# Patient Record
Sex: Male | Born: 1950 | Race: White | Hispanic: No | Marital: Married | Smoking: Never smoker
Health system: Southern US, Community
[De-identification: ages and names within clinical notes are randomized; demographics above are authoritative.]

## PROBLEM LIST (undated history)

## (undated) DIAGNOSIS — F419 Anxiety disorder, unspecified: Secondary | ICD-10-CM

## (undated) DIAGNOSIS — E7800 Pure hypercholesterolemia, unspecified: Secondary | ICD-10-CM

## (undated) DIAGNOSIS — I10 Essential (primary) hypertension: Secondary | ICD-10-CM

---

## 2006-10-01 ENCOUNTER — Ambulatory Visit (HOSPITAL_BASED_OUTPATIENT_CLINIC_OR_DEPARTMENT_OTHER): Admission: RE | Admit: 2006-10-01 | Discharge: 2006-10-01 | Payer: Self-pay | Admitting: Family Medicine

## 2006-10-07 ENCOUNTER — Ambulatory Visit: Payer: Self-pay | Admitting: Internal Medicine

## 2006-11-29 ENCOUNTER — Ambulatory Visit (HOSPITAL_BASED_OUTPATIENT_CLINIC_OR_DEPARTMENT_OTHER): Admission: RE | Admit: 2006-11-29 | Discharge: 2006-11-29 | Payer: Self-pay | Admitting: Family Medicine

## 2006-12-02 ENCOUNTER — Ambulatory Visit: Payer: Self-pay | Admitting: Internal Medicine

## 2016-09-08 DIAGNOSIS — Z23 Encounter for immunization: Secondary | ICD-10-CM | POA: Diagnosis not present

## 2016-09-25 DIAGNOSIS — R972 Elevated prostate specific antigen [PSA]: Secondary | ICD-10-CM | POA: Diagnosis not present

## 2016-09-25 DIAGNOSIS — N400 Enlarged prostate without lower urinary tract symptoms: Secondary | ICD-10-CM | POA: Diagnosis not present

## 2016-09-25 DIAGNOSIS — N5201 Erectile dysfunction due to arterial insufficiency: Secondary | ICD-10-CM | POA: Diagnosis not present

## 2016-10-12 DIAGNOSIS — Z0000 Encounter for general adult medical examination without abnormal findings: Secondary | ICD-10-CM | POA: Diagnosis not present

## 2016-10-12 DIAGNOSIS — I10 Essential (primary) hypertension: Secondary | ICD-10-CM | POA: Diagnosis not present

## 2016-10-12 DIAGNOSIS — Z6830 Body mass index (BMI) 30.0-30.9, adult: Secondary | ICD-10-CM | POA: Diagnosis not present

## 2016-10-12 DIAGNOSIS — E7800 Pure hypercholesterolemia, unspecified: Secondary | ICD-10-CM | POA: Diagnosis not present

## 2016-10-24 DIAGNOSIS — S83241A Other tear of medial meniscus, current injury, right knee, initial encounter: Secondary | ICD-10-CM | POA: Diagnosis not present

## 2016-10-30 DIAGNOSIS — M25561 Pain in right knee: Secondary | ICD-10-CM | POA: Diagnosis not present

## 2016-11-06 DIAGNOSIS — S83241A Other tear of medial meniscus, current injury, right knee, initial encounter: Secondary | ICD-10-CM | POA: Diagnosis not present

## 2016-11-20 DIAGNOSIS — Z01818 Encounter for other preprocedural examination: Secondary | ICD-10-CM | POA: Diagnosis not present

## 2016-11-20 DIAGNOSIS — J329 Chronic sinusitis, unspecified: Secondary | ICD-10-CM | POA: Diagnosis not present

## 2016-12-08 DIAGNOSIS — G8918 Other acute postprocedural pain: Secondary | ICD-10-CM | POA: Diagnosis not present

## 2016-12-08 DIAGNOSIS — M94261 Chondromalacia, right knee: Secondary | ICD-10-CM | POA: Diagnosis not present

## 2016-12-08 DIAGNOSIS — M23261 Derangement of other lateral meniscus due to old tear or injury, right knee: Secondary | ICD-10-CM | POA: Diagnosis not present

## 2016-12-08 DIAGNOSIS — S83281A Other tear of lateral meniscus, current injury, right knee, initial encounter: Secondary | ICD-10-CM | POA: Diagnosis not present

## 2016-12-08 DIAGNOSIS — S83241A Other tear of medial meniscus, current injury, right knee, initial encounter: Secondary | ICD-10-CM | POA: Diagnosis not present

## 2016-12-08 DIAGNOSIS — M23231 Derangement of other medial meniscus due to old tear or injury, right knee: Secondary | ICD-10-CM | POA: Diagnosis not present

## 2016-12-14 DIAGNOSIS — S83241D Other tear of medial meniscus, current injury, right knee, subsequent encounter: Secondary | ICD-10-CM | POA: Diagnosis not present

## 2016-12-14 DIAGNOSIS — S83281D Other tear of lateral meniscus, current injury, right knee, subsequent encounter: Secondary | ICD-10-CM | POA: Diagnosis not present

## 2017-01-08 DIAGNOSIS — S83281D Other tear of lateral meniscus, current injury, right knee, subsequent encounter: Secondary | ICD-10-CM | POA: Diagnosis not present

## 2017-01-08 DIAGNOSIS — S83241D Other tear of medial meniscus, current injury, right knee, subsequent encounter: Secondary | ICD-10-CM | POA: Diagnosis not present

## 2017-02-20 DIAGNOSIS — L814 Other melanin hyperpigmentation: Secondary | ICD-10-CM | POA: Diagnosis not present

## 2017-02-20 DIAGNOSIS — D1800 Hemangioma unspecified site: Secondary | ICD-10-CM | POA: Diagnosis not present

## 2017-02-20 DIAGNOSIS — C4401 Basal cell carcinoma of skin of lip: Secondary | ICD-10-CM | POA: Diagnosis not present

## 2017-02-20 DIAGNOSIS — D225 Melanocytic nevi of trunk: Secondary | ICD-10-CM | POA: Diagnosis not present

## 2017-02-20 DIAGNOSIS — L570 Actinic keratosis: Secondary | ICD-10-CM | POA: Diagnosis not present

## 2017-02-20 DIAGNOSIS — C44319 Basal cell carcinoma of skin of other parts of face: Secondary | ICD-10-CM | POA: Diagnosis not present

## 2017-03-05 DIAGNOSIS — Z23 Encounter for immunization: Secondary | ICD-10-CM | POA: Diagnosis not present

## 2017-03-05 DIAGNOSIS — Z0000 Encounter for general adult medical examination without abnormal findings: Secondary | ICD-10-CM | POA: Diagnosis not present

## 2017-03-05 DIAGNOSIS — Z125 Encounter for screening for malignant neoplasm of prostate: Secondary | ICD-10-CM | POA: Diagnosis not present

## 2017-03-05 DIAGNOSIS — E7800 Pure hypercholesterolemia, unspecified: Secondary | ICD-10-CM | POA: Diagnosis not present

## 2017-03-13 DIAGNOSIS — Z0000 Encounter for general adult medical examination without abnormal findings: Secondary | ICD-10-CM | POA: Diagnosis not present

## 2017-03-13 DIAGNOSIS — R972 Elevated prostate specific antigen [PSA]: Secondary | ICD-10-CM | POA: Diagnosis not present

## 2017-03-13 DIAGNOSIS — I10 Essential (primary) hypertension: Secondary | ICD-10-CM | POA: Diagnosis not present

## 2017-03-13 DIAGNOSIS — E7800 Pure hypercholesterolemia, unspecified: Secondary | ICD-10-CM | POA: Diagnosis not present

## 2017-04-13 DIAGNOSIS — K429 Umbilical hernia without obstruction or gangrene: Secondary | ICD-10-CM | POA: Diagnosis not present

## 2017-04-18 DIAGNOSIS — C4401 Basal cell carcinoma of skin of lip: Secondary | ICD-10-CM | POA: Diagnosis not present

## 2017-06-04 DIAGNOSIS — G4733 Obstructive sleep apnea (adult) (pediatric): Secondary | ICD-10-CM | POA: Diagnosis not present

## 2017-07-03 DIAGNOSIS — Z23 Encounter for immunization: Secondary | ICD-10-CM | POA: Diagnosis not present

## 2017-09-05 DIAGNOSIS — I129 Hypertensive chronic kidney disease with stage 1 through stage 4 chronic kidney disease, or unspecified chronic kidney disease: Secondary | ICD-10-CM | POA: Diagnosis not present

## 2017-09-05 DIAGNOSIS — E7800 Pure hypercholesterolemia, unspecified: Secondary | ICD-10-CM | POA: Diagnosis not present

## 2017-09-11 DIAGNOSIS — E7800 Pure hypercholesterolemia, unspecified: Secondary | ICD-10-CM | POA: Diagnosis not present

## 2017-09-11 DIAGNOSIS — I10 Essential (primary) hypertension: Secondary | ICD-10-CM | POA: Diagnosis not present

## 2017-09-11 DIAGNOSIS — G4733 Obstructive sleep apnea (adult) (pediatric): Secondary | ICD-10-CM | POA: Diagnosis not present

## 2017-09-11 DIAGNOSIS — Z9989 Dependence on other enabling machines and devices: Secondary | ICD-10-CM | POA: Diagnosis not present

## 2017-09-25 DIAGNOSIS — R972 Elevated prostate specific antigen [PSA]: Secondary | ICD-10-CM | POA: Diagnosis not present

## 2017-10-03 DIAGNOSIS — N5201 Erectile dysfunction due to arterial insufficiency: Secondary | ICD-10-CM | POA: Diagnosis not present

## 2017-10-03 DIAGNOSIS — R972 Elevated prostate specific antigen [PSA]: Secondary | ICD-10-CM | POA: Diagnosis not present

## 2017-10-03 DIAGNOSIS — N400 Enlarged prostate without lower urinary tract symptoms: Secondary | ICD-10-CM | POA: Diagnosis not present

## 2017-10-09 DIAGNOSIS — G4733 Obstructive sleep apnea (adult) (pediatric): Secondary | ICD-10-CM | POA: Diagnosis not present

## 2017-10-23 HISTORY — PX: MENISCUS REPAIR: SHX5179

## 2017-11-09 DIAGNOSIS — G4733 Obstructive sleep apnea (adult) (pediatric): Secondary | ICD-10-CM | POA: Diagnosis not present

## 2017-11-27 DIAGNOSIS — Z9989 Dependence on other enabling machines and devices: Secondary | ICD-10-CM | POA: Diagnosis not present

## 2017-11-27 DIAGNOSIS — G4733 Obstructive sleep apnea (adult) (pediatric): Secondary | ICD-10-CM | POA: Diagnosis not present

## 2017-12-10 DIAGNOSIS — G4733 Obstructive sleep apnea (adult) (pediatric): Secondary | ICD-10-CM | POA: Diagnosis not present

## 2018-01-07 DIAGNOSIS — G4733 Obstructive sleep apnea (adult) (pediatric): Secondary | ICD-10-CM | POA: Diagnosis not present

## 2018-02-07 DIAGNOSIS — G4733 Obstructive sleep apnea (adult) (pediatric): Secondary | ICD-10-CM | POA: Diagnosis not present

## 2018-03-09 DIAGNOSIS — G4733 Obstructive sleep apnea (adult) (pediatric): Secondary | ICD-10-CM | POA: Diagnosis not present

## 2018-03-13 DIAGNOSIS — Z125 Encounter for screening for malignant neoplasm of prostate: Secondary | ICD-10-CM | POA: Diagnosis not present

## 2018-03-13 DIAGNOSIS — I129 Hypertensive chronic kidney disease with stage 1 through stage 4 chronic kidney disease, or unspecified chronic kidney disease: Secondary | ICD-10-CM | POA: Diagnosis not present

## 2018-03-14 DIAGNOSIS — E785 Hyperlipidemia, unspecified: Secondary | ICD-10-CM | POA: Diagnosis not present

## 2018-03-14 DIAGNOSIS — Z0000 Encounter for general adult medical examination without abnormal findings: Secondary | ICD-10-CM | POA: Diagnosis not present

## 2018-03-14 DIAGNOSIS — I456 Pre-excitation syndrome: Secondary | ICD-10-CM | POA: Diagnosis not present

## 2018-03-14 DIAGNOSIS — N529 Male erectile dysfunction, unspecified: Secondary | ICD-10-CM | POA: Diagnosis not present

## 2018-03-14 DIAGNOSIS — M19041 Primary osteoarthritis, right hand: Secondary | ICD-10-CM | POA: Diagnosis not present

## 2018-03-14 DIAGNOSIS — I10 Essential (primary) hypertension: Secondary | ICD-10-CM | POA: Diagnosis not present

## 2018-03-14 DIAGNOSIS — Z23 Encounter for immunization: Secondary | ICD-10-CM | POA: Diagnosis not present

## 2018-03-14 DIAGNOSIS — H9319 Tinnitus, unspecified ear: Secondary | ICD-10-CM | POA: Diagnosis not present

## 2018-03-14 DIAGNOSIS — G4733 Obstructive sleep apnea (adult) (pediatric): Secondary | ICD-10-CM | POA: Diagnosis not present

## 2018-03-14 DIAGNOSIS — R972 Elevated prostate specific antigen [PSA]: Secondary | ICD-10-CM | POA: Diagnosis not present

## 2018-03-15 ENCOUNTER — Other Ambulatory Visit: Payer: Self-pay | Admitting: Urology

## 2018-03-15 DIAGNOSIS — N5201 Erectile dysfunction due to arterial insufficiency: Secondary | ICD-10-CM | POA: Diagnosis not present

## 2018-03-15 DIAGNOSIS — N400 Enlarged prostate without lower urinary tract symptoms: Secondary | ICD-10-CM | POA: Diagnosis not present

## 2018-03-15 DIAGNOSIS — R972 Elevated prostate specific antigen [PSA]: Secondary | ICD-10-CM

## 2018-03-26 DIAGNOSIS — L570 Actinic keratosis: Secondary | ICD-10-CM | POA: Diagnosis not present

## 2018-03-26 DIAGNOSIS — D225 Melanocytic nevi of trunk: Secondary | ICD-10-CM | POA: Diagnosis not present

## 2018-03-26 DIAGNOSIS — L814 Other melanin hyperpigmentation: Secondary | ICD-10-CM | POA: Diagnosis not present

## 2018-03-26 DIAGNOSIS — Z85828 Personal history of other malignant neoplasm of skin: Secondary | ICD-10-CM | POA: Diagnosis not present

## 2018-03-26 DIAGNOSIS — D485 Neoplasm of uncertain behavior of skin: Secondary | ICD-10-CM | POA: Diagnosis not present

## 2018-03-26 DIAGNOSIS — L280 Lichen simplex chronicus: Secondary | ICD-10-CM | POA: Diagnosis not present

## 2018-03-26 DIAGNOSIS — L821 Other seborrheic keratosis: Secondary | ICD-10-CM | POA: Diagnosis not present

## 2018-04-01 DIAGNOSIS — Z1212 Encounter for screening for malignant neoplasm of rectum: Secondary | ICD-10-CM | POA: Diagnosis not present

## 2018-04-01 DIAGNOSIS — Z1211 Encounter for screening for malignant neoplasm of colon: Secondary | ICD-10-CM | POA: Diagnosis not present

## 2018-04-08 ENCOUNTER — Ambulatory Visit
Admission: RE | Admit: 2018-04-08 | Discharge: 2018-04-08 | Disposition: A | Discharge status: Home / Self Care | Payer: Self-pay | Source: Ambulatory Visit | Attending: Urology | Admitting: Urology

## 2018-04-08 DIAGNOSIS — R972 Elevated prostate specific antigen [PSA]: Secondary | ICD-10-CM

## 2018-04-08 MED ORDER — GADOBENATE DIMEGLUMINE 529 MG/ML IV SOLN
20.0000 mL | Freq: Once | INTRAVENOUS | Status: AC | PRN
  Administered 2018-04-08: 20 mL via INTRAVENOUS

## 2018-04-09 DIAGNOSIS — G4733 Obstructive sleep apnea (adult) (pediatric): Secondary | ICD-10-CM | POA: Diagnosis not present

## 2018-04-30 DIAGNOSIS — E785 Hyperlipidemia, unspecified: Secondary | ICD-10-CM | POA: Diagnosis not present

## 2018-05-06 DIAGNOSIS — R972 Elevated prostate specific antigen [PSA]: Secondary | ICD-10-CM | POA: Diagnosis not present

## 2018-05-09 DIAGNOSIS — G4733 Obstructive sleep apnea (adult) (pediatric): Secondary | ICD-10-CM | POA: Diagnosis not present

## 2018-05-14 DIAGNOSIS — R69 Illness, unspecified: Secondary | ICD-10-CM | POA: Diagnosis not present

## 2018-05-17 DIAGNOSIS — R972 Elevated prostate specific antigen [PSA]: Secondary | ICD-10-CM | POA: Diagnosis not present

## 2018-06-05 DIAGNOSIS — N529 Male erectile dysfunction, unspecified: Secondary | ICD-10-CM | POA: Diagnosis not present

## 2018-06-05 DIAGNOSIS — R69 Illness, unspecified: Secondary | ICD-10-CM | POA: Diagnosis not present

## 2018-06-09 DIAGNOSIS — G4733 Obstructive sleep apnea (adult) (pediatric): Secondary | ICD-10-CM | POA: Diagnosis not present

## 2018-06-10 DIAGNOSIS — R195 Other fecal abnormalities: Secondary | ICD-10-CM | POA: Diagnosis not present

## 2018-07-03 DIAGNOSIS — R195 Other fecal abnormalities: Secondary | ICD-10-CM | POA: Diagnosis not present

## 2018-07-03 DIAGNOSIS — K648 Other hemorrhoids: Secondary | ICD-10-CM | POA: Diagnosis not present

## 2018-07-03 DIAGNOSIS — D125 Benign neoplasm of sigmoid colon: Secondary | ICD-10-CM | POA: Diagnosis not present

## 2018-07-03 DIAGNOSIS — K5730 Diverticulosis of large intestine without perforation or abscess without bleeding: Secondary | ICD-10-CM | POA: Diagnosis not present

## 2018-07-05 DIAGNOSIS — R69 Illness, unspecified: Secondary | ICD-10-CM | POA: Diagnosis not present

## 2018-07-05 DIAGNOSIS — D125 Benign neoplasm of sigmoid colon: Secondary | ICD-10-CM | POA: Diagnosis not present

## 2018-07-09 DIAGNOSIS — R69 Illness, unspecified: Secondary | ICD-10-CM | POA: Diagnosis not present

## 2018-07-10 DIAGNOSIS — G4733 Obstructive sleep apnea (adult) (pediatric): Secondary | ICD-10-CM | POA: Diagnosis not present

## 2018-08-09 DIAGNOSIS — G4733 Obstructive sleep apnea (adult) (pediatric): Secondary | ICD-10-CM | POA: Diagnosis not present

## 2018-09-13 DIAGNOSIS — R69 Illness, unspecified: Secondary | ICD-10-CM | POA: Diagnosis not present

## 2018-11-13 DIAGNOSIS — R69 Illness, unspecified: Secondary | ICD-10-CM | POA: Diagnosis not present

## 2018-11-15 DIAGNOSIS — G4733 Obstructive sleep apnea (adult) (pediatric): Secondary | ICD-10-CM | POA: Diagnosis not present

## 2018-12-05 DIAGNOSIS — K429 Umbilical hernia without obstruction or gangrene: Secondary | ICD-10-CM | POA: Diagnosis not present

## 2019-01-08 ENCOUNTER — Other Ambulatory Visit: Payer: Self-pay

## 2019-01-08 ENCOUNTER — Observation Stay (HOSPITAL_BASED_OUTPATIENT_CLINIC_OR_DEPARTMENT_OTHER)
Admission: EM | Admit: 2019-01-08 | Discharge: 2019-01-09 | Disposition: A | Discharge status: Home / Self Care | Payer: Medicare HMO | Attending: Internal Medicine | Admitting: Internal Medicine

## 2019-01-08 ENCOUNTER — Encounter (HOSPITAL_BASED_OUTPATIENT_CLINIC_OR_DEPARTMENT_OTHER): Payer: Self-pay

## 2019-01-08 ENCOUNTER — Emergency Department (HOSPITAL_BASED_OUTPATIENT_CLINIC_OR_DEPARTMENT_OTHER): Payer: Medicare HMO

## 2019-01-08 DIAGNOSIS — G459 Transient cerebral ischemic attack, unspecified: Secondary | ICD-10-CM | POA: Diagnosis not present

## 2019-01-08 DIAGNOSIS — G4733 Obstructive sleep apnea (adult) (pediatric): Secondary | ICD-10-CM | POA: Diagnosis present

## 2019-01-08 DIAGNOSIS — Z79899 Other long term (current) drug therapy: Secondary | ICD-10-CM | POA: Diagnosis not present

## 2019-01-08 DIAGNOSIS — I10 Essential (primary) hypertension: Secondary | ICD-10-CM | POA: Diagnosis not present

## 2019-01-08 DIAGNOSIS — R531 Weakness: Secondary | ICD-10-CM | POA: Diagnosis present

## 2019-01-08 DIAGNOSIS — M6281 Muscle weakness (generalized): Secondary | ICD-10-CM | POA: Insufficient documentation

## 2019-01-08 DIAGNOSIS — E785 Hyperlipidemia, unspecified: Secondary | ICD-10-CM | POA: Diagnosis present

## 2019-01-08 DIAGNOSIS — R69 Illness, unspecified: Secondary | ICD-10-CM | POA: Diagnosis not present

## 2019-01-08 DIAGNOSIS — Z823 Family history of stroke: Secondary | ICD-10-CM | POA: Diagnosis not present

## 2019-01-08 DIAGNOSIS — F1010 Alcohol abuse, uncomplicated: Secondary | ICD-10-CM | POA: Diagnosis not present

## 2019-01-08 DIAGNOSIS — R200 Anesthesia of skin: Secondary | ICD-10-CM | POA: Diagnosis not present

## 2019-01-08 HISTORY — DX: Pure hypercholesterolemia, unspecified: E78.00

## 2019-01-08 HISTORY — DX: Essential (primary) hypertension: I10

## 2019-01-08 HISTORY — DX: Anxiety disorder, unspecified: F41.9

## 2019-01-08 LAB — PROTIME-INR
INR: 1 (ref 0.8–1.2)
Prothrombin Time: 13.2 seconds (ref 11.4–15.2)

## 2019-01-08 LAB — CBC
HCT: 49.8 % (ref 39.0–52.0)
HEMOGLOBIN: 15.8 g/dL (ref 13.0–17.0)
MCH: 31.7 pg (ref 26.0–34.0)
MCHC: 31.7 g/dL (ref 30.0–36.0)
MCV: 100 fL (ref 80.0–100.0)
Platelets: 193 10*3/uL (ref 150–400)
RBC: 4.98 MIL/uL (ref 4.22–5.81)
RDW: 12.6 % (ref 11.5–15.5)
WBC: 6.5 10*3/uL (ref 4.0–10.5)
nRBC: 0 % (ref 0.0–0.2)

## 2019-01-08 LAB — URINALYSIS, ROUTINE W REFLEX MICROSCOPIC
BILIRUBIN URINE: NEGATIVE
Glucose, UA: NEGATIVE mg/dL
Ketones, ur: NEGATIVE mg/dL
Leukocytes,Ua: NEGATIVE
Nitrite: NEGATIVE
Protein, ur: NEGATIVE mg/dL
Specific Gravity, Urine: <1.005 — ABNORMAL LOW (ref 1.005–1.030)
pH: 5.5 (ref 5.0–8.0)

## 2019-01-08 LAB — DIFFERENTIAL
Abs Immature Granulocytes: 0.02 10*3/uL (ref 0.00–0.07)
Basophils Absolute: 0 10*3/uL (ref 0.0–0.1)
Basophils Relative: 1 %
Eosinophils Absolute: 0.1 10*3/uL (ref 0.0–0.5)
Eosinophils Relative: 2 %
Immature Granulocytes: 0 %
Lymphocytes Relative: 28 %
Lymphs Abs: 1.8 10*3/uL (ref 0.7–4.0)
Monocytes Absolute: 0.8 10*3/uL (ref 0.1–1.0)
Monocytes Relative: 12 %
NEUTROS ABS: 3.8 10*3/uL (ref 1.7–7.7)
NEUTROS PCT: 57 %

## 2019-01-08 LAB — APTT: aPTT: 31 seconds (ref 24–36)

## 2019-01-08 LAB — COMPREHENSIVE METABOLIC PANEL
ALT: 39 U/L (ref 0–44)
AST: 31 U/L (ref 15–41)
Albumin: 4.5 g/dL (ref 3.5–5.0)
Alkaline Phosphatase: 49 U/L (ref 38–126)
Anion gap: 7 (ref 5–15)
BUN: 19 mg/dL (ref 8–23)
CO2: 24 mmol/L (ref 22–32)
Calcium: 9.6 mg/dL (ref 8.9–10.3)
Chloride: 106 mmol/L (ref 98–111)
Creatinine, Ser: 0.92 mg/dL (ref 0.61–1.24)
GFR calc Af Amer: >60 mL/min (ref >60)
GFR calc non Af Amer: >60 mL/min (ref >60)
Glucose, Bld: 88 mg/dL (ref 70–99)
Potassium: 3.8 mmol/L (ref 3.5–5.1)
Sodium: 137 mmol/L (ref 135–145)
Total Bilirubin: 0.5 mg/dL (ref 0.3–1.2)
Total Protein: 7.8 g/dL (ref 6.5–8.1)

## 2019-01-08 LAB — RAPID URINE DRUG SCREEN, HOSP PERFORMED
Amphetamines: NOT DETECTED
Barbiturates: NOT DETECTED
Benzodiazepines: NOT DETECTED
COCAINE: NOT DETECTED
Opiates: NOT DETECTED
Tetrahydrocannabinol: NOT DETECTED

## 2019-01-08 LAB — URINALYSIS, MICROSCOPIC (REFLEX): WBC, UA: NONE SEEN WBC/hpf (ref 0–5)

## 2019-01-08 LAB — ETHANOL: Alcohol, Ethyl (B): 64 mg/dL — ABNORMAL HIGH (ref <10)

## 2019-01-08 MED ORDER — ASPIRIN 81 MG PO CHEW
324.0000 mg | CHEWABLE_TABLET | Freq: Once | ORAL | Status: AC
  Administered 2019-01-08: 324 mg via ORAL
  Filled 2019-01-08: qty 4

## 2019-01-08 NOTE — ED Provider Notes (Signed)
Emergency Department Provider Note   I have reviewed the triage vital signs and the nursing notes.   HISTORY  Chief Complaint Numbness   HPI Thomas Bass is a 68 y.o. male with PMH of HTN and HLD to the emergency department for evaluation of acute onset right arm and leg weakness/numbness.  Symptoms occurred 90 minutes prior to ED presentation.  Last seen normal at 5PM.  Patient states he has had 3-4 beers which is not unusual for him.  He was not feeling intoxicated.  He went to urinate and felt fine.  He states when he turned to leave he was unable to move his right leg or arm.  He felt numbness "like they were asleep" and almost fell.  He was able to catch himself and stayed in place for approximately 4 minutes at which time he found his symptoms had mostly resolved.  He looked in the mirror and did not notice any facial asymmetry but did have difficulty moving his right arm toward his nose.  Denies any headache.  No neck or back pain.  No fall with head trauma.  Patient is compliant with his medications and sees his PCP regularly. Denies any CP or palpitations.   Past Medical History:  Diagnosis Date   Anxiety    High cholesterol    Hypertension     Patient Active Problem List   Diagnosis Date Noted   TIA (transient ischemic attack) 01/08/2019    Past Surgical History:  Procedure Laterality Date   MENISCUS REPAIR Right 2019   Allergies Patient has no known allergies.  No family history on file.  Social History Social History   Tobacco Use   Smoking status: Never Smoker   Smokeless tobacco: Never Used  Substance Use Topics   Alcohol use: Yes    Comment: weekly   Drug use: Not on file    Review of Systems  Constitutional: No fever/chills Eyes: No visual changes. ENT: No sore throat. Cardiovascular: Denies chest pain. Respiratory: Denies shortness of breath. Gastrointestinal: No abdominal pain.  No nausea, no vomiting.  No diarrhea.  No  constipation. Genitourinary: Negative for dysuria. Musculoskeletal: Negative for back pain. Skin: Negative for rash. Neurological: Negative for headaches. Positive right arm/leg weakness and numbness (resolved).   10-point ROS otherwise negative.  ____________________________________________   PHYSICAL EXAM:  VITAL SIGNS: ED Triage Vitals  Enc Vitals Group     BP 01/08/19 1821 (!) 138/97     Pulse Rate 01/08/19 1821 80     Resp 01/08/19 1821 18     Temp 01/08/19 1821 98.6 F (37 C)     Temp Source 01/08/19 1821 Oral     SpO2 01/08/19 1821 97 %     Weight 01/08/19 1822 222 lb 7.1 oz (100.9 kg)     Height 01/08/19 1822 5\' 10"  (1.778 m)   Constitutional: Alert and oriented. Well appearing and in no acute distress. Eyes: Conjunctivae are normal. PERRL.  Head: Atraumatic. Nose: No congestion/rhinnorhea. Mouth/Throat: Mucous membranes are moist.  Neck: No stridor.  Cardiovascular: Normal rate, regular rhythm. Good peripheral circulation. Grossly normal heart sounds.   Respiratory: Normal respiratory effort.  No retractions. Lungs CTAB. Gastrointestinal: Soft and nontender. No distention.  Musculoskeletal: No lower extremity tenderness nor edema. No gross deformities of extremities. Neurologic:  Normal speech and language. No gross focal neurologic deficits are appreciated. CN exam 2-12 normal. Normal finger to nose. Normal grip strength. No pronator drift. Normal sensation and strength in the lower  extremities. No leg drift.    ____________________________________________   LABS (all labs ordered are listed, but only abnormal results are displayed)  Labs Reviewed  ETHANOL - Abnormal; Notable for the following components:      Result Value   Alcohol, Ethyl (B) 64 (*)    All other components within normal limits  URINALYSIS, ROUTINE W REFLEX MICROSCOPIC - Abnormal; Notable for the following components:   Color, Urine STRAW (*)    Specific Gravity, Urine <1.005 (*)    Hgb  urine dipstick TRACE (*)    All other components within normal limits  URINALYSIS, MICROSCOPIC (REFLEX) - Abnormal; Notable for the following components:   Bacteria, UA RARE (*)    All other components within normal limits  PROTIME-INR  APTT  CBC  DIFFERENTIAL  COMPREHENSIVE METABOLIC PANEL  RAPID URINE DRUG SCREEN, HOSP PERFORMED   ____________________________________________  EKG   EKG Interpretation  Date/Time:  Wednesday January 08 2019 18:38:15 EDT Ventricular Rate:  81 PR Interval:    QRS Duration: 83 QT Interval:  360 QTC Calculation: 418 R Axis:   53 Text Interpretation:  Sinus rhythm Baseline wander in lead(s) V1 No STEMI.  Confirmed by Nanda Quinton [phone]) on 01/08/2019 6:48:35 PM       ____________________________________________  RADIOLOGY  Ct Head Wo Contrast  Result Date: 01/08/2019 CLINICAL DATA:  Right arm and leg numbness for 2 hours. EXAM: CT HEAD WITHOUT CONTRAST TECHNIQUE: Contiguous axial images were obtained from the base of the skull through the vertex without intravenous contrast. COMPARISON:  None. FINDINGS: Brain: There is no evidence for acute hemorrhage, hydrocephalus, mass lesion, or abnormal extra-axial fluid collection. No definite CT evidence for acute infarction. Diffuse loss of parenchymal volume is consistent with atrophy. Patchy low attenuation in the deep hemispheric and periventricular white matter is nonspecific, but likely reflects chronic microvascular ischemic demyelination. Age-indeterminate lacunar infarct noted anterior limb right internal capsule. Vascular: No hyperdense vessel or unexpected calcification. Skull: No evidence for fracture. No worrisome lytic or sclerotic lesion. Sinuses/Orbits: The visualized paranasal sinuses and mastoid air cells are clear. Visualized portions of the globes and intraorbital fat are unremarkable. Other: None. IMPRESSION: 1. No acute intracranial abnormality. 2. Atrophy with chronic small vessel white  matter ischemic disease. 3. 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 Electronically Signed   By: Misty Stanley M.D.   On: 01/08/2019 19:16    ____________________________________________   PROCEDURES  Procedure(s) performed:   Procedures  None  ____________________________________________   INITIAL IMPRESSION / ASSESSMENT AND PLAN / ED COURSE  Pertinent labs & imaging results that were available during my care of the patient were reviewed by me and considered in my medical decision making (see chart for details).  Patient presents to the emergency department for evaluation of strokelike symptoms which have since resolved.  Concern clinically for TIA.  No back or neck pain.  No focal deficits on my evaluation here. Plan for CT head, ASA, and labs. Considering admit for TIA pending results.   07:33 PM Spoke with Dr. Londell Moh who will consult when at [facility]. Will admit for TIA w/u. Labs and CT imaging reviewed. Patient remains neuro intact. Discussed results and plan with patient and family at bedside.   Discussed patient's case with Hospitalist, Dr. Alcario Drought to request admission. Patient and family (if present) updated with plan. Care transferred to Hospitalist service.  I reviewed all nursing notes, vitals, pertinent old records, EKGs, labs, imaging (as available).  ____________________________________________  FINAL CLINICAL IMPRESSION(S) /  ED DIAGNOSES  Final diagnoses:  TIA (transient ischemic attack)    MEDICATIONS GIVEN DURING THIS VISIT:  Medications  aspirin chewable tablet 324 mg (324 mg Oral Given 01/08/19 1851)    Note:  This document was prepared using Dragon voice recognition software and may include unintentional dictation errors.  Nanda Quinton, MD Emergency Medicine    Zulema Pulaski, [facility], MD 01/08/19 [phone]

## 2019-01-08 NOTE — Plan of Care (Signed)
TIA with R sided weakness for about 3 mins duration, now back to NL.  Dr. Lorraine Lax will put on list to see.  Sending to Tele obs.

## 2019-01-08 NOTE — Progress Notes (Addendum)
Patient arrived to 3W04; A/O x 4; VSS; No complaint of pain; Room air; Tele notified, Physician Notified

## 2019-01-08 NOTE — ED Triage Notes (Signed)
90 minutes ago, pt was standing at the sink and went to turn and his right arm and leg went numb and stopped working for approximately 3 minutes. Pt denies any current symptoms, no facial asymmetry, no weakness, pt is alert and oriented and was ambulatory without issue.

## 2019-01-09 ENCOUNTER — Observation Stay (HOSPITAL_COMMUNITY): Payer: Medicare HMO

## 2019-01-09 ENCOUNTER — Observation Stay (HOSPITAL_BASED_OUTPATIENT_CLINIC_OR_DEPARTMENT_OTHER): Payer: Medicare HMO

## 2019-01-09 ENCOUNTER — Other Ambulatory Visit (HOSPITAL_COMMUNITY): Payer: Medicare HMO

## 2019-01-09 ENCOUNTER — Encounter (HOSPITAL_COMMUNITY): Payer: Self-pay | Admitting: Internal Medicine

## 2019-01-09 DIAGNOSIS — G459 Transient cerebral ischemic attack, unspecified: Secondary | ICD-10-CM | POA: Diagnosis not present

## 2019-01-09 DIAGNOSIS — E785 Hyperlipidemia, unspecified: Secondary | ICD-10-CM | POA: Diagnosis not present

## 2019-01-09 DIAGNOSIS — R69 Illness, unspecified: Secondary | ICD-10-CM | POA: Diagnosis not present

## 2019-01-09 DIAGNOSIS — R9082 White matter disease, unspecified: Secondary | ICD-10-CM | POA: Diagnosis not present

## 2019-01-09 DIAGNOSIS — G4733 Obstructive sleep apnea (adult) (pediatric): Secondary | ICD-10-CM

## 2019-01-09 DIAGNOSIS — F1010 Alcohol abuse, uncomplicated: Secondary | ICD-10-CM | POA: Diagnosis not present

## 2019-01-09 DIAGNOSIS — R918 Other nonspecific abnormal finding of lung field: Secondary | ICD-10-CM | POA: Diagnosis not present

## 2019-01-09 DIAGNOSIS — I10 Essential (primary) hypertension: Secondary | ICD-10-CM | POA: Diagnosis present

## 2019-01-09 LAB — ECHOCARDIOGRAM COMPLETE
Height: 70 in
Weight: 3520.31 oz

## 2019-01-09 LAB — HEMOGLOBIN A1C
HEMOGLOBIN A1C: 5.4 % (ref 4.8–5.6)
Mean Plasma Glucose: 108.28 mg/dL

## 2019-01-09 LAB — LIPID PANEL
Cholesterol: 200 mg/dL (ref 0–200)
HDL: 49 mg/dL (ref >40)
LDL Cholesterol: 125 mg/dL — ABNORMAL HIGH (ref 0–99)
TRIGLYCERIDES: 131 mg/dL (ref <150)
Total CHOL/HDL Ratio: 4.1 RATIO
VLDL: 26 mg/dL (ref 0–40)

## 2019-01-09 LAB — HIV ANTIBODY (ROUTINE TESTING W REFLEX): HIV Screen 4th Generation wRfx: NONREACTIVE

## 2019-01-09 MED ORDER — ACETAMINOPHEN 650 MG RE SUPP: 650.0000 mg | RECTAL | Status: DC | PRN

## 2019-01-09 MED ORDER — ASPIRIN 325 MG PO TABS: 325.0000 mg | ORAL_TABLET | Freq: Every day | ORAL | Status: DC

## 2019-01-09 MED ORDER — CLOPIDOGREL BISULFATE 75 MG PO TABS: 75.0000 mg | ORAL_TABLET | Freq: Every day | ORAL | 0 refills | Status: AC

## 2019-01-09 MED ORDER — STROKE: EARLY STAGES OF RECOVERY BOOK
Freq: Once | Status: AC
  Administered 2019-01-09: 01:00:00

## 2019-01-09 MED ORDER — ASPIRIN EC 81 MG PO TBEC
81.0000 mg | DELAYED_RELEASE_TABLET | Freq: Every day | ORAL | Status: DC
  Administered 2019-01-09: 81 mg via ORAL
  Filled 2019-01-09: qty 1

## 2019-01-09 MED ORDER — ENOXAPARIN SODIUM 40 MG/0.4ML ~~LOC~~ SOLN
40.0000 mg | Freq: Every day | SUBCUTANEOUS | Status: DC
  Administered 2019-01-09: 40 mg via SUBCUTANEOUS
  Filled 2019-01-09: qty 0.4

## 2019-01-09 MED ORDER — ACETAMINOPHEN 160 MG/5ML PO SOLN: 650.0000 mg | ORAL | Status: DC | PRN

## 2019-01-09 MED ORDER — ROSUVASTATIN CALCIUM 20 MG PO TABS
40.0000 mg | ORAL_TABLET | Freq: Every day | ORAL | Status: DC
  Administered 2019-01-09: 40 mg via ORAL
  Filled 2019-01-09: qty 2

## 2019-01-09 MED ORDER — CLOPIDOGREL BISULFATE 75 MG PO TABS: 75.0000 mg | ORAL_TABLET | Freq: Every day | ORAL | Status: DC

## 2019-01-09 MED ORDER — ASPIRIN 81 MG PO TBEC: 81.0000 mg | DELAYED_RELEASE_TABLET | Freq: Every day | ORAL | Status: AC

## 2019-01-09 MED ORDER — ACETAMINOPHEN 325 MG PO TABS: 650.0000 mg | ORAL_TABLET | ORAL | Status: DC | PRN

## 2019-01-09 MED ORDER — ASPIRIN 300 MG RE SUPP: 300.0000 mg | Freq: Every day | RECTAL | Status: DC

## 2019-01-09 MED ORDER — CLOPIDOGREL BISULFATE 75 MG PO TABS
300.0000 mg | ORAL_TABLET | Freq: Once | ORAL | Status: AC
  Administered 2019-01-09: 300 mg via ORAL
  Filled 2019-01-09: qty 4

## 2019-01-09 MED ORDER — ROSUVASTATIN CALCIUM 40 MG PO TABS: 40.0000 mg | ORAL_TABLET | Freq: Every day | ORAL | 1 refills | Status: AC

## 2019-01-09 MED ORDER — SENNOSIDES-DOCUSATE SODIUM 8.6-50 MG PO TABS: 1.0000 | ORAL_TABLET | Freq: Every evening | ORAL | Status: DC | PRN

## 2019-01-09 NOTE — Progress Notes (Addendum)
STROKE TEAM PROGRESS NOTE   INTERVAL HISTORY His wife is at the bedside.  He reported R sided arm greater than leg weakness 2-4 minutes. He was alone and did not talk. No hx prior TIAs.he denied any accompanying headache, dizziness or vision problems. He has no prior history of strokes or TIAs  Vitals:   01/09/19 0247 01/09/19 0445 01/09/19 0646 01/09/19 0805  BP: (!) 129/92 126/86 114/83 123/78  Pulse: 66 63 63 66  Resp: 18 18 19 16   Temp: 97.8 F (36.6 C) 98.1 F (36.7 C) 98.2 F (36.8 C) 98 F (36.7 C)  TempSrc: Oral Oral Oral Oral  SpO2: 98% 97% 98% 96%  Weight:      Height:        CBC:  Recent Labs  Lab 01/08/19 1844  WBC 6.5  NEUTROABS 3.8  HGB 15.8  HCT 49.8  MCV 100.0  PLT 573    Basic Metabolic Panel:  Recent Labs  Lab 01/08/19 1844  NA 137  K 3.8  CL 106  CO2 24  GLUCOSE 88  BUN 19  CREATININE 0.92  CALCIUM 9.6   Lipid Panel:     Component Value Date/Time   CHOL 200 01/09/2019 0442   TRIG 131 01/09/2019 0442   HDL 49 01/09/2019 0442   CHOLHDL 4.1 01/09/2019 0442   VLDL 26 01/09/2019 0442   LDLCALC 125 (H) 01/09/2019 0442   HgbA1c:  Lab Results  Component Value Date   HGBA1C 5.4 01/09/2019   Urine Drug Screen:     Component Value Date/Time   LABOPIA NONE DETECTED 01/08/2019 1844   COCAINSCRNUR NONE DETECTED 01/08/2019 1844   LABBENZ NONE DETECTED 01/08/2019 1844   AMPHETMU NONE DETECTED 01/08/2019 1844   THCU NONE DETECTED 01/08/2019 1844   LABBARB NONE DETECTED 01/08/2019 1844    Alcohol Level     Component Value Date/Time   ETH 64 (H) 01/08/2019 1844    IMAGING Dg Chest 2 View  Result Date: 01/09/2019 CLINICAL DATA:  TIA. EXAM: CHEST - 2 VIEW COMPARISON:  No recent prior. FINDINGS: Mediastinum and hilar structures normal. Solitary nodular opacities noted over both lung bases most likely nipple shadows. Confirmation with repeat PA and lateral chest x-ray with nipple markers suggested no focal infiltrate. Biapical pleural  thickening noted consistent scarring. No pleural effusion or pneumothorax. Heart size normal. Degenerative change thoracic spine IMPRESSION: Solitary nodular opacities noted both lung bases most likely nipple shadows. Confirmation with repeat PA and lateral chest x-ray with nipple markers suggested. No acute cardiopulmonary disease noted. Electronically Signed   By: Marcello Moores  Register   On: 01/09/2019 06:39   Ct Head Wo Contrast  Result Date: 01/08/2019 CLINICAL DATA:  Right arm and leg numbness for 2 hours. EXAM: CT HEAD WITHOUT CONTRAST TECHNIQUE: Contiguous axial images were obtained from the base of the skull through the vertex without intravenous contrast. COMPARISON:  None. FINDINGS: Brain: There is no evidence for acute hemorrhage, hydrocephalus, mass lesion, or abnormal extra-axial fluid collection. No definite CT evidence for acute infarction. Diffuse loss of parenchymal volume is consistent with atrophy. Patchy low attenuation in the deep hemispheric and periventricular white matter is nonspecific, but likely reflects chronic microvascular ischemic demyelination. Age-indeterminate lacunar infarct noted anterior limb right internal capsule. Vascular: No hyperdense vessel or unexpected calcification. Skull: No evidence for fracture. No worrisome lytic or sclerotic lesion. Sinuses/Orbits: The visualized paranasal sinuses and mastoid air cells are clear. Visualized portions of the globes and intraorbital fat are unremarkable. Other: None. IMPRESSION:  1. No acute intracranial abnormality. 2. Atrophy with chronic small vessel white matter ischemic disease. 3. 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 Electronically Signed   By: Misty Stanley M.D.   On: 01/08/2019 19:16   Mr Virgel Paling EX Contrast  Result Date: 01/09/2019 CLINICAL DATA:  Initial evaluation for acute episode of right-sided weakness/numbness, followed by complete resolution of symptoms. EXAM: MRI HEAD WITHOUT  CONTRAST MRA HEAD WITHOUT CONTRAST TECHNIQUE: Multiplanar, multiecho pulse sequences of the brain and surrounding structures were obtained without intravenous contrast. Angiographic images of the head were obtained using MRA technique without contrast. COMPARISON:  Prior CT from 01/08/2019. FINDINGS: MRI HEAD FINDINGS Brain: Cerebral volume within normal limits for age. Patchy T2/FLAIR hyperintensity within the periventricular deep white matter both cerebral hemispheres, nonspecific, but most like related chronic micro vessel ischemic disease double mild to moderate nature. No abnormal foci of restricted diffusion to suggest acute or subacute ischemia. Gray-white matter differentiation maintained. No encephalomalacia to suggest chronic cortical infarction. No evidence for acute or chronic intracranial hemorrhage. No mass lesion, midline shift or mass effect. No hydrocephalus. No extra-axial fluid collection. Pituitary gland suprasellar region within normal limits. Midline structures intact. Vascular: Major intracranial vascular flow voids maintained. Skull and upper cervical spine: Craniocervical junction normal. Upper cervical spine within normal limits. Bone marrow signal intensity normal. No scalp soft tissue abnormality. Sinuses/Orbits: Globes orbital soft tissues within normal limits. Mild scattered mucosal thickening within the ethmoidal air cells. Paranasal sinuses are otherwise largely clear. No mastoid effusion. Inner ear structures normal. Other: None. MRA HEAD FINDINGS ANTERIOR CIRCULATION: Distal cervical segments of the internal carotid arteries are widely patent with symmetric antegrade flow. Petrous, cavernous, and supraclinoid segments widely patent without stenosis. A1 segments widely patent. Normal anterior communicating artery. Anterior cerebral arteries widely patent to their distal aspects. M1 segments widely patent without stenosis or occlusion. Normal MCA bifurcations. Distal MCA branches well  perfused and symmetric. POSTERIOR CIRCULATION: Vertebral arteries widely patent to the vertebrobasilar junction without stenosis. Posterior inferior cerebral arteries not seen. Basilar widely patent to its distal aspect without stenosis. Superior cerebellar and posterior cerebral arteries widely patent bilaterally. No intracranial aneurysm. IMPRESSION: MRI HEAD IMPRESSION: 1. No acute intracranial infarct or other abnormality identified. 2. Mild-to-moderate cerebral white matter changes common nonspecific, but most like related to chronic microvascular ischemic disease. MRA HEAD IMPRESSION: Normal intracranial MRA. No large vessel occlusion. No hemodynamically significant or correctable stenosis. Electronically Signed   By: Jeannine Boga M.D.   On: 01/09/2019 03:52   Mr Brain Wo Contrast  Result Date: 01/09/2019 CLINICAL DATA:  Initial evaluation for acute episode of right-sided weakness/numbness, followed by complete resolution of symptoms. EXAM: MRI HEAD WITHOUT CONTRAST MRA HEAD WITHOUT CONTRAST TECHNIQUE: Multiplanar, multiecho pulse sequences of the brain and surrounding structures were obtained without intravenous contrast. Angiographic images of the head were obtained using MRA technique without contrast. COMPARISON:  Prior CT from 01/08/2019. FINDINGS: MRI HEAD FINDINGS Brain: Cerebral volume within normal limits for age. Patchy T2/FLAIR hyperintensity within the periventricular deep white matter both cerebral hemispheres, nonspecific, but most like related chronic micro vessel ischemic disease double mild to moderate nature. No abnormal foci of restricted diffusion to suggest acute or subacute ischemia. Gray-white matter differentiation maintained. No encephalomalacia to suggest chronic cortical infarction. No evidence for acute or chronic intracranial hemorrhage. No mass lesion, midline shift or mass effect. No hydrocephalus. No extra-axial fluid collection. Pituitary gland suprasellar region  within normal limits. Midline structures intact. Vascular: Major intracranial  vascular flow voids maintained. Skull and upper cervical spine: Craniocervical junction normal. Upper cervical spine within normal limits. Bone marrow signal intensity normal. No scalp soft tissue abnormality. Sinuses/Orbits: Globes orbital soft tissues within normal limits. Mild scattered mucosal thickening within the ethmoidal air cells. Paranasal sinuses are otherwise largely clear. No mastoid effusion. Inner ear structures normal. Other: None. MRA HEAD FINDINGS ANTERIOR CIRCULATION: Distal cervical segments of the internal carotid arteries are widely patent with symmetric antegrade flow. Petrous, cavernous, and supraclinoid segments widely patent without stenosis. A1 segments widely patent. Normal anterior communicating artery. Anterior cerebral arteries widely patent to their distal aspects. M1 segments widely patent without stenosis or occlusion. Normal MCA bifurcations. Distal MCA branches well perfused and symmetric. POSTERIOR CIRCULATION: Vertebral arteries widely patent to the vertebrobasilar junction without stenosis. Posterior inferior cerebral arteries not seen. Basilar widely patent to its distal aspect without stenosis. Superior cerebellar and posterior cerebral arteries widely patent bilaterally. No intracranial aneurysm. IMPRESSION: MRI HEAD IMPRESSION: 1. No acute intracranial infarct or other abnormality identified. 2. Mild-to-moderate cerebral white matter changes common nonspecific, but most like related to chronic microvascular ischemic disease. MRA HEAD IMPRESSION: Normal intracranial MRA. No large vessel occlusion. No hemodynamically significant or correctable stenosis. Electronically Signed   By: Jeannine Boga M.D.   On: 01/09/2019 03:52    PHYSICAL EXAM  pleasant middle-age Caucasian male not in distress. . Afebrile. Head is nontraumatic. Neck is supple without bruit.    Cardiac exam no murmur or  gallop. Lungs are clear to auscultation. Distal pulses are well felt. Neurological Exam ;  Awake  Alert oriented x 3. Normal speech and language.eye movements full without nystagmus.fundi were not visualized. Vision acuity and fields appear normal. Hearing is normal. Palatal movements are normal. Face symmetric. Tongue midline. Normal strength, tone, reflexes and coordination. Normal sensation. Gait deferred.  NIHSS 0 Premorbid MRS 0 ABCD2 score  6  ASSESSMENT/PLAN [name] is a 68 y.o. male with history of anxiety, HTN, HLD presenting with transient R arm > leg weakness.   L brain TIA  CT head No acute stroke. Small vessel disease. Atrophy. Old ALIC lacune.  MRI  No acute infarct. Small vessel disease.   MRA  Normal. No LVO.  Carotid Doppler  pending   2D Echo w/ bubble  pending   LDL 125  HgbA1c 5.4  Lovenox 40 mg sq daily for VTE prophylaxis  No antithrombotic prior to admission, now on aspirin 81 mg daily and clopidogrel 75 mg daily following plavix load. Continue DAPT x 3 weeks then aspirin alone.  Therapy recommendations:  No therapy needs  Disposition:  Return home Follow-up Stroke Clinic at [facility] in 4 weeks. Office will call with appointment date and time. Order placed.  Hypertension  Stable . BP goal normotensive  Hyperlipidemia  Home meds:  crestor 10  Now on crestor 40  LDL 125, goal < 70  Continue statin at discharge  Other Stroke Risk Factors  Advanced age  ETOH use, he drinks 10-12 beers 1 day a week, advised to drink no more than 2 drink(s) a day  Obesity, Body mass index is 31.57 kg/m., recommend weight loss, diet and exercise as appropriate   Family hx stroke (mother)  obstructive sleep apnea, on CPAP at home  Other [facility] day # 0  Burnetta Sabin, MSN, APRN, ANVP-BC, AGPCNP-BC Advanced Practice Stroke Nurse [place] for Schedule & Pager  information 01/09/2019 10:05  AM  I have personally obtained history,examined this patient, reviewed notes, independently viewed imaging studies, participated in medical decision making and plan of care.ROS completed by me personally and pertinent positives fully documented  I have made any additions or clarifications directly to the above note. Agree with note above. He presented with transient right body weakness likely (hemispheric TIA. Remains at risk for recurrent strokes and TIAs. Continue ongoing stroke workup. Recommend doing an anteverted therapy for 3 weeks followed by aspirin. Patient may also consider possible participation in the [employer]  [facility] stroke prevention prior and will be given information to review and decide.greater than 50% time during this 35 minute visit was spent on counseling and coordination of care about when necessary discussion about stroke prevention and treatment and answering questions. Discussed with Dr. Saul Fordyce, MD Medical Director [place] Pager: [phone] 01/09/2019 2:07 PM  To contact Stroke Continuity provider, please refer to [email or web address] After hours, contact General Neurology

## 2019-01-09 NOTE — Consult Note (Signed)
Requesting Physician: Dr. Alcario Drought    Chief Complaint: Transient right arm weakness  History obtained from: Patient and Chart     HPI:                                                                                                                                       Thomas Bass is an 68 y.o. male with past medical history of hypertension, hyperlipidemia presents to the ER at [facility] with transient right arm and leg weakness.  Patient was drinking a few beers, when he noticed suddenly after using the bathroom while he was turning to leave he was unable to move his right leg arm.  He felt as if they were asleep and lasted for approximately 4 minutes.  Symptoms completely resolved at the time of arrival to the emergency department. He is not on any antiplatelet medications.  He was transferred to [facility] for evaluation of TIA symptoms.  Date last known well: 3.18-20 Time last known well: 5 PM tPA Given: No, symptoms resolved NIHSS: 0 Baseline MRS 0    Past Medical History:  Diagnosis Date  . Anxiety   . High cholesterol   . Hypertension     Past Surgical History:  Procedure Laterality Date  . MENISCUS REPAIR Right 2019    Family History  Problem Relation Age of Onset  . Stroke Mother    Social History:  reports that he has never smoked. He has never used smokeless tobacco. He reports current alcohol use. No history on file for drug.  Allergies: No Known Allergies  Medications:                                                                                                                        I reviewed home medications   ROS:  14 systems reviewed and negative except above   Examination:                                                                                                      General: Appears  well-developed Psych: Affect appropriate to situation Eyes: No scleral injection HENT: No OP obstrucion Head: Normocephalic.  Cardiovascular: Normal rate and regular rhythm.  Respiratory: Effort normal and breath sounds normal to anterior ascultation GI: Soft.  No distension. There is no tenderness.  Skin: WDI    Neurological Examination Mental Status: Alert, oriented, thought content appropriate.  Speech fluent without evidence of aphasia. Able to follow 3 step commands without difficulty. Cranial Nerves: II: Visual fields grossly normal,  III,IV, VI: ptosis not present, extra-ocular motions intact bilaterally, pupils equal, round, reactive to light and accommodation V,VII: smile symmetric, facial light touch sensation normal bilaterally VIII: hearing normal bilaterally IX,X: uvula rises symmetrically XI: bilateral shoulder shrug XII: midline tongue extension Motor: Right : Upper extremity   5/5    Left:     Upper extremity   5/5  Lower extremity   5/5     Lower extremity   5/5 Tone and bulk:normal tone throughout; no atrophy noted Sensory: Pinprick and light touch intact throughout, bilaterally Deep Tendon Reflexes: 2+ and symmetric throughout Plantars: Right: downgoing   Left: downgoing Cerebellar: normal finger-to-nose, normal rapid alternating movements and normal heel-to-shin test Gait: normal gait and station     Lab Results: Basic Metabolic Panel: Recent Labs  Lab 01/08/19 1844  NA 137  K 3.8  CL 106  CO2 24  GLUCOSE 88  BUN 19  CREATININE 0.92  CALCIUM 9.6    CBC: Recent Labs  Lab 01/08/19 1844  WBC 6.5  NEUTROABS 3.8  HGB 15.8  HCT 49.8  MCV 100.0  PLT 193    Coagulation Studies: Recent Labs    01/08/19 1844  LABPROT 13.2  INR 1.0    Imaging: Ct Head Wo Contrast  Result Date: 01/08/2019 CLINICAL DATA:  Right arm and leg numbness for 2 hours. EXAM: CT HEAD WITHOUT CONTRAST TECHNIQUE: Contiguous axial images were obtained from the base  of the skull through the vertex without intravenous contrast. COMPARISON:  None. FINDINGS: Brain: There is no evidence for acute hemorrhage, hydrocephalus, mass lesion, or abnormal extra-axial fluid collection. No definite CT evidence for acute infarction. Diffuse loss of parenchymal volume is consistent with atrophy. Patchy low attenuation in the deep hemispheric and periventricular white matter is nonspecific, but likely reflects chronic microvascular ischemic demyelination. Age-indeterminate lacunar infarct noted anterior limb right internal capsule. Vascular: No hyperdense vessel or unexpected calcification. Skull: No evidence for fracture. No worrisome lytic or sclerotic lesion. Sinuses/Orbits: The visualized paranasal sinuses and mastoid air cells are clear. Visualized portions of the globes and intraorbital fat are unremarkable. Other: None. IMPRESSION: 1. No acute intracranial abnormality. 2. Atrophy with chronic small vessel white matter ischemic disease. 3. 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 Electronically Signed   By: Misty Stanley M.D.   On: 01/08/2019 19:16  ASSESSMENT AND PLAN  68 y.o. male with past medical history of hypertension, hyperlipidemia presents to the  with transient right arm and leg weakness.  Transient Ischemic attack  Recommend # MRI of the brain without contrast #MRA Head  #carotid doppler #Transthoracic Echo  # Start patient on ASA 81mg  daily and Plavix 75mg  daily x 3 weeks  #Start or continue Atorvastatin 80 mg/other high intensity statin # BP goal: permissive HTN upto 220/120 mmHg ( 185/110 if patient has CHF, CKD) # HBAIC and Lipid profile # Telemetry monitoring # Frequent neuro checks # NPO until passes stroke swallow screen  Please page stroke NP  Or  PA  Or MD from 8am -4 pm  as this patient from this time will be  followed by the stroke.   You can look them up on [email or web address]  Password [facility]      [facility] Pager Number [phone]

## 2019-01-09 NOTE — Progress Notes (Signed)
PT Cancellation Note  Patient Details Name: Thomas Bass MRN: [id number] DOB: August 31, 1951   Cancelled Treatment:    Reason Eval/Treat Not Completed: PT screened, no needs identified, will sign off.  Discussed with pt/family before sign off.  They agree. 01/09/2019  Donnella Sham, PT Acute Rehabilitation Services [phone]  (pager) [phone]  (office) 01/09/2019  Donnella Sham, PT Acute Rehabilitation Services [phone]  (pager) [phone]  (office)   Thomas Bass 01/09/2019, 3:06 PM

## 2019-01-09 NOTE — Progress Notes (Signed)
  Echocardiogram 2D Echocardiogram has been performed.  Thomas Bass 01/09/2019, 12:36 PM

## 2019-01-09 NOTE — TOC Initial Note (Signed)
Transition of Care [facility]) - Initial/Assessment Note    Patient Details  Name: Thomas Bass MRN: [id number] Date of Birth: December 08, 1950  Transition of Care [facility]) CM/SW Contact:    Pollie Friar, RN Phone Number: 01/09/2019, 12:45 PM  Clinical Narrative:                   Expected Discharge Plan: Home/Self Care Barriers to Discharge: Continued Medical Work up   Patient Goals and CMS Choice Patient states their goals for this hospitalization and ongoing recovery are:: no deficits      Expected Discharge Plan and Services Expected Discharge Plan: Home/Self Care       Living arrangements for the past 2 months: Single Family Home(2 levels)                          Prior Living Arrangements/Services Living arrangements for the past 2 months: Single Family Home(2 levels) Lives with:: Spouse Patient language and need for interpreter reviewed:: Yes(no needs.) Do you feel safe going back to the place where you live?: Yes      Need for Family Participation in Patient Care: No (Comment) Care giver support system in place?: Yes (comment)(wife able to provide intermittent supervision)   Criminal Activity/Legal Involvement Pertinent to Current Situation/Hospitalization: No - Comment as needed  Activities of Daily Living      Permission Sought/Granted                  Emotional Assessment Appearance:: Appears stated age Attitude/Demeanor/Rapport: Gracious Affect (typically observed): Accepting, Appropriate, Pleasant Orientation: : Oriented to Self, Oriented to Place, Oriented to  Time, Oriented to Situation   Psych Involvement: No (comment)  Admission diagnosis:  TIA (transient ischemic attack) [G45.9] Patient Active Problem List   Diagnosis Date Noted  . HTN (hypertension) 01/09/2019  . HLD (hyperlipidemia) 01/09/2019  . ETOH abuse 01/09/2019  . OSA (obstructive sleep apnea) 01/09/2019  . TIA (transient ischemic attack) 01/08/2019   PCP:  Deland Pretty,  MD Pharmacy:   [facility], [place] [address] Phone: [phone] Fax: [phone]     Social Determinants of Health (SDOH) Interventions  Pt drives or wife can provide transportation. No issues with medications at home.   Readmission Risk Interventions No flowsheet data found.

## 2019-01-09 NOTE — TOC Transition Note (Signed)
Transition of Care [facility]) - CM/SW Discharge Note   Patient Details  Name: Thomas Bass MRN: [id number] Date of Birth: 1951/03/14  Transition of Care [facility]) CM/SW Contact:  Pollie Friar, RN Phone Number: 01/09/2019, 3:01 PM   Clinical Narrative:    No f/u per PT/OT and no DME needs. Wife to transport home.   Final next level of care: Home/Self Care Barriers to Discharge: No Barriers Identified   Patient Goals and CMS Choice Patient states their goals for this hospitalization and ongoing recovery are:: no deficits      Discharge Placement                       Discharge Plan and Services                          Social Determinants of Health (SDOH) Interventions     Readmission Risk Interventions No flowsheet data found.

## 2019-01-09 NOTE — H&P (Signed)
History and Physical    Thomas Bass YNW:[id number] DOB: Aug 15, 1951 DOA: 01/08/2019  PCP: Deland Pretty, MD  Patient coming from: Home  I have personally briefly reviewed patient's old medical records in [facility]  Chief Complaint: R sided weakness  HPI: Thomas Bass is a 68 y.o. male with medical history significant of HLD, HTN.  Patient had 3 min episode of acute onset R arm and leg weakness/numbness.  Occurred 90 min PTA in ED.  Complete resolution of symptoms at time of presentation to ED and remains asymptomatic.  Patient states he has had 3-4 beers which is not unusual for him.  He was not feeling intoxicated.  He went to urinate and felt fine.  He states when he turned to leave he was unable to move his right leg or arm.  He felt numbness "like they were asleep" and almost fell.  He was able to catch himself and stayed in place for approximately 3-4 minutes at which time he found his symptoms had mostly resolved.  He looked in the mirror and did not notice any facial asymmetry but did have difficulty moving his right arm toward his nose.  Denies any headache.  No neck or back pain.  No fall with head trauma.  Patient is compliant with his medications and sees his PCP regularly. Denies any CP or palpitations.    ED Course: Patient remained asymptomatic.  CT head neg other than for an Age indeterminate lacunar infarct anterior limb right internal capsule.  Neurology consulted and hospitalist asked to admit for TIA work up.   Review of Systems: As per HPI otherwise 10 point review of systems negative.   Past Medical History:  Diagnosis Date  . Anxiety   . High cholesterol   . Hypertension     Past Surgical History:  Procedure Laterality Date  . MENISCUS REPAIR Right 2019     reports that he has never smoked. He has never used smokeless tobacco. He reports current alcohol use. No history on file for drug.  No Known Allergies  Family History  Problem Relation  Age of Onset  . Stroke Mother      Prior to Admission medications   Medication Sig Start Date End Date Taking? Authorizing Provider  lisinopril (PRINIVIL,ZESTRIL) 10 MG tablet Take 10 mg by mouth daily.   Yes [provider]  mirtazapine (REMERON) 7.5 MG tablet Take 7.5 mg by mouth at bedtime.   Yes [provider]  rosuvastatin (CRESTOR) 10 MG tablet  12/04/18   [provider]    Physical Exam: Vitals:   01/08/19 1822 01/08/19 2004 01/08/19 2031 01/08/19 2117  BP:  121/84 117/80 (!) 132/98  Pulse:  74 72 69  Resp:  17 17 18   Temp:    97.8 F (36.6 C)  TempSrc:    Oral  SpO2:  99% 100% 100%  Weight: 100.9 kg     Height: 5\' 10"  (1.778 m)       Constitutional: NAD, calm, comfortable Eyes: PERRL, lids and conjunctivae normal ENMT: Mucous membranes are moist. Posterior pharynx clear of any exudate or lesions.Normal dentition.  Neck: normal, supple, no masses, no thyromegaly Respiratory: clear to auscultation bilaterally, no wheezing, no crackles. Normal respiratory effort. No accessory muscle use.  Cardiovascular: Regular rate and rhythm, no murmurs / rubs / gallops. No extremity edema. 2+ pedal pulses. No carotid bruits.  Abdomen: no tenderness, no masses palpated. No hepatosplenomegaly. Bowel sounds positive.  Musculoskeletal: no clubbing /  cyanosis. No joint deformity upper and lower extremities. Good ROM, no contractures. Normal muscle tone.  Skin: no rashes, lesions, ulcers. No induration Neurologic: CN 2-12 grossly intact. Sensation intact, DTR normal. Strength 5/5 in all 4.  Psychiatric: Normal judgment and insight. Alert and oriented x 3. Normal mood.    Labs on Admission: I have personally reviewed following labs and imaging studies  CBC: Recent Labs  Lab 01/08/19 1844  WBC 6.5  NEUTROABS 3.8  HGB 15.8  HCT 49.8  MCV 100.0  PLT 938   Basic Metabolic Panel: Recent Labs  Lab 01/08/19 1844  NA 137  K 3.8  CL 106  CO2 24  GLUCOSE  88  BUN 19  CREATININE 0.92  CALCIUM 9.6   GFR: Estimated Creatinine Clearance: 92.8 mL/min (by C-G formula based on SCr of 0.92 mg/dL). Liver Function Tests: Recent Labs  Lab 01/08/19 1844  AST 31  ALT 39  ALKPHOS 49  BILITOT 0.5  PROT 7.8  ALBUMIN 4.5   No results for input(s): LIPASE, AMYLASE in the last 168 hours. No results for input(s): AMMONIA in the last 168 hours. Coagulation Profile: Recent Labs  Lab 01/08/19 1844  INR 1.0   Cardiac Enzymes: No results for input(s): CKTOTAL, CKMB, CKMBINDEX, TROPONINI in the last 168 hours. BNP (last 3 results) No results for input(s): PROBNP in the last 8760 hours. HbA1C: No results for input(s): HGBA1C in the last 72 hours. CBG: No results for input(s): GLUCAP in the last 168 hours. Lipid Profile: No results for input(s): CHOL, HDL, LDLCALC, TRIG, CHOLHDL, LDLDIRECT in the last 72 hours. Thyroid Function Tests: No results for input(s): TSH, T4TOTAL, FREET4, T3FREE, THYROIDAB in the last 72 hours. Anemia Panel: No results for input(s): VITAMINB12, FOLATE, FERRITIN, TIBC, IRON, RETICCTPCT in the last 72 hours. Urine analysis:    Component Value Date/Time   COLORURINE STRAW (A) 01/08/2019 1844   APPEARANCEUR CLEAR 01/08/2019 1844   LABSPEC <1.005 (L) 01/08/2019 1844   PHURINE 5.5 01/08/2019 1844   GLUCOSEU NEGATIVE 01/08/2019 1844   HGBUR TRACE (A) 01/08/2019 1844   BILIRUBINUR NEGATIVE 01/08/2019 1844   KETONESUR NEGATIVE 01/08/2019 1844   PROTEINUR NEGATIVE 01/08/2019 1844   NITRITE NEGATIVE 01/08/2019 1844   LEUKOCYTESUR NEGATIVE 01/08/2019 1844    Radiological Exams on Admission: Ct Head Wo Contrast  Result Date: 01/08/2019 CLINICAL DATA:  Right arm and leg numbness for 2 hours. EXAM: CT HEAD WITHOUT CONTRAST TECHNIQUE: Contiguous axial images were obtained from the base of the skull through the vertex without intravenous contrast. COMPARISON:  None. FINDINGS: Brain: There is no evidence for acute hemorrhage,  hydrocephalus, mass lesion, or abnormal extra-axial fluid collection. No definite CT evidence for acute infarction. Diffuse loss of parenchymal volume is consistent with atrophy. Patchy low attenuation in the deep hemispheric and periventricular white matter is nonspecific, but likely reflects chronic microvascular ischemic demyelination. Age-indeterminate lacunar infarct noted anterior limb right internal capsule. Vascular: No hyperdense vessel or unexpected calcification. Skull: No evidence for fracture. No worrisome lytic or sclerotic lesion. Sinuses/Orbits: The visualized paranasal sinuses and mastoid air cells are clear. Visualized portions of the globes and intraorbital fat are unremarkable. Other: None. IMPRESSION: 1. No acute intracranial abnormality. 2. Atrophy with chronic small vessel white matter ischemic disease. 3. 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 Electronically Signed   By: Misty Stanley M.D.   On: 01/08/2019 19:16    EKG: Independently reviewed.  Assessment/Plan Principal Problem:   TIA (transient ischemic attack) Active Problems:  HTN (hypertension)   HLD (hyperlipidemia)    1. TIA - 1. TIA pathway and work up 2. MRI/MRA 3. Neuro consult 4. ASA 325 daily for now till neuro decides otherwise 5. 2d echo 6. Carotid dopplers 7. A1C, FLP 8. PT/OT/SLP 9. Tele monitor 2. HLD - Continue statin 3. HTN - continue home BP meds  DVT prophylaxis: Lovenox Code Status: Full Family Communication: Wife at bedside Disposition Plan: Home after admit Consults called: None Admission status: Place in 78    [place], [place] [facility]  How to contact the [facility] Attending or Consulting provider [place] or covering provider during after hours [place], for this patient?  1. Check the care team in [facility] and look for a) attending/consulting TRH provider listed and b) the [facility] team listed 2. Log into [email or web address]  Amion Physician Scheduling  and messaging for groups and whole hospitals  On call and physician scheduling software for group practices, residents, hospitalists and other medical providers for call, clinic, rotation and shift schedules. OnCall Enterprise is a hospital-wide system for scheduling doctors and paging doctors on call. EasyPlot is for scientific plotting and data analysis.  [email or web address]  and use [place]'s universal password to access. If you do not have the password, please contact the hospital operator.  3. Locate the [facility] provider you are looking for under [facility] and page to a number that you can be directly reached. 4. If you still have difficulty reaching the provider, please page the [facility] (Director on Call) for the Hospitalists listed on amion for assistance.  01/09/2019, 12:01 AM

## 2019-01-09 NOTE — Progress Notes (Signed)
Occupational Therapy Evaluation Patient Details Name: Thomas Bass MRN: [id number] DOB: 04/19/1951 Today's Date: 01/09/2019    History of Present Illness 68 y.o. male with past medical history of hypertension, hyperlipidemia presents to the ER at [facility] with transient right arm and leg weakness. He was transferred to [facility] for evaluation of TIA symptoms. MRI - for CVA.    Clinical Impression   PTA pt independent with ADL and mobility and worked Restaurant manager, fast food. Pt currently at baseline. Pt educated on TIA, lifestyle modifications and warning signs/symptoms of CVA using [employer]. Written information provided. OT signing off.     Follow Up Recommendations  No OT follow up    Equipment Recommendations  None recommended by OT    Recommendations for Other Services       Precautions / Restrictions Precautions Precautions: None      Mobility Bed Mobility Overal bed mobility: Independent                Transfers Overall transfer level: Independent                    Balance Overall balance assessment: No apparent balance deficits (not formally assessed)                                         ADL either performed or assessed with clinical judgement   ADL Overall ADL's : At baseline                                             Vision Baseline Vision/History: Wears glasses Patient Visual Report: No change from baseline       Perception Perception Comments: WFL   Praxis Praxis Praxis tested?: Within functional limits    Pertinent Vitals/Pain Pain Assessment: No/denies pain     Hand Dominance Right   Extremity/Trunk Assessment Upper Extremity Assessment Upper Extremity Assessment: Overall WFL for tasks assessed   Lower Extremity Assessment Lower Extremity Assessment: Overall WFL for tasks assessed   Cervical / Trunk Assessment Cervical / Trunk Assessment: Normal    Communication Communication Communication: No difficulties   Cognition Arousal/Alertness: Awake/alert Behavior During Therapy: WFL for tasks assessed/performed Overall Cognitive Status: Within Functional Limits for tasks assessed                                     General Comments       Exercises     Shoulder Instructions      Home Living Family/patient expects to be discharged to:: Private residence Living Arrangements: Spouse/significant other Available Help at Discharge: Available 24 hours/day Type of Home: House       Home Layout: One level     Bathroom Shower/Tub: Occupational psychologist: Standard Bathroom Accessibility: Yes How Accessible: Accessible via walker            Prior Functioning/Environment Level of Independence: Independent        Comments: works doing Engineer, technical sales Problem List: Decreased knowledge of precautions      OT Treatment/Interventions: Patient/family education    OT Goals(Current goals can be found in the care plan  section) Acute Rehab OT Goals Patient Stated Goal: to not have a stroke OT Goal Formulation: All assessment and education complete, DC therapy  OT Frequency:     Barriers to D/C:            Co-evaluation              AM-PAC OT "6 Clicks" Daily Activity     Outcome Measure Help from another person eating meals?: None Help from another person taking care of personal grooming?: None Help from another person toileting, which includes using toliet, bedpan, or urinal?: None Help from another person bathing (including washing, rinsing, drying)?: None Help from another person to put on and taking off regular upper body clothing?: None Help from another person to put on and taking off regular lower body clothing?: None 6 Click Score: 24   End of Session Nurse Communication: Mobility status  Activity Tolerance: Patient tolerated treatment well Patient left: in bed;with call  bell/phone within reach;with family/visitor present  OT Visit Diagnosis: Muscle weakness (generalized) (M62.81)                Time: 2876-8115 OT Time Calculation (min): 20 min Charges:  OT General Charges $OT Visit: 1 Visit OT Evaluation $OT Eval Low Complexity: [place], OT/L   Acute OT Clinical Specialist Acute Rehabilitation Services Pager [phone] Office [phone]   [facility] 01/09/2019, 9:39 AM

## 2019-01-09 NOTE — Discharge Summary (Signed)
Physician Discharge Summary  Thomas Bass WLN:[id number] DOB: 03-Jan-1951 DOA: 01/08/2019  PCP: Deland Pretty, MD  Admit date: 01/08/2019 Discharge date: 01/09/2019  Time spent: 45 minutes  Recommendations for Outpatient Follow-up:  1. Follow up with stroke clinic 4 weeks. Office will call to schedule 2. Take medications as directed   Discharge Diagnoses:  Principal Problem:   TIA (transient ischemic attack) Active Problems:   HTN (hypertension)   HLD (hyperlipidemia)   ETOH abuse   OSA (obstructive sleep apnea)   Discharge Condition: stable  Diet recommendation: heart healthy  Filed Weights   01/08/19 1822 01/08/19 2117  Weight: 100.9 kg 99.8 kg    History of present illness:  Thomas Bass is an 68 y.o. male with past medical history of hypertension, hyperlipidemia presented to the ER at [facility] 01/08/19 with transient right arm and leg weakness.  Patient reported drinking a few beers, when he noticed suddenly after using the bathroom while he was turning to leave he was unable to move his right leg arm.  He felt as if they were asleep and lasted for approximately 4 minutes. Also noted right arm weakness/tingling but was able to touch nose with index finger. Looked in mirror and did not notice facial drooping. No difficulty swallowing.  Symptoms completely resolved at the time of arrival to the emergency department. He is not on any antiplatelet medications.  Hospital Course:  1. TIA - right sided weakness. Resolved after few minutes and no recurrence. CT head with no stroke, small vessel disease atrophy and old lacunar infarct.  Mri without acute intracranial infarct or other abnormality, mild to moderate cerebral white matter changes non-specific. MRA reveals vertebral arteries widely patent to the vertebrobasilar junction without stenosis. Posterior inferior cerebral arteries not seen. Basilar widely patent. Superior cerebellar and posterior cerebral arteries  widely patent bilaterally. ehco with EF 60% and grade 1 DD. Carotid dopplar results pending at discharge. A1c 5.4. lipid panel with LDL 125. OT with no recommendations.  Evaluated by neuro who recommends starting aspirin and plavix and continuing plavix for 21 days then just aspirin. Also recommended increasing statin dose. Pt evaluated no recommendations 2. HLD - Continue statin at higher dose per neuro 3. HTN - fair control. Home meds include lisinopril.      Procedures:  Echo  Carotid doppler  Consultations:  Dr Leonie Man neurology  Discharge Exam: Vitals:   01/09/19 1000 01/09/19 1200  BP: 119/88 124/90  Pulse: 77 70  Resp: 18 16  Temp: 98.1 F (36.7 C) 97.8 F (36.6 C)  SpO2: 97% 97%    General: sitting up in bed no acute distress Cardiovascular: rrr no mgr no LE edema Respiratory: normal effort BS clear bilaterally no wheeze  Discharge Instructions   Discharge Instructions    Call MD for:  persistant dizziness or light-headedness   Complete by:  As directed    Call MD for:  temperature >100.4   Complete by:  As directed    Diet - low sodium heart healthy   Complete by:  As directed    Discharge instructions   Complete by:  As directed    Take medication as instructed Decrease etoh consumption Follow up with neuro as instructed   Increase activity slowly   Complete by:  As directed      Allergies as of 01/09/2019   No Known Allergies     Medication List    TAKE these medications   aspirin 81 MG EC tablet  Take 1 tablet (81 mg total) by mouth daily. Start taking on:  January 10, 2019   clopidogrel 75 MG tablet Commonly known as:  PLAVIX Take 1 tablet (75 mg total) by mouth daily for 21 days. Start taking on:  January 10, 2019   lisinopril 10 MG tablet Commonly known as:  PRINIVIL,ZESTRIL Take 10 mg by mouth daily.   mirtazapine 7.5 MG tablet Commonly known as:  REMERON Take 7.5 mg by mouth at bedtime.   naproxen sodium 220 MG tablet Commonly  known as:  ALEVE Take 220-440 mg by mouth daily as needed (pain).   rosuvastatin 40 MG tablet Commonly known as:  CRESTOR Take 1 tablet (40 mg total) by mouth daily at 6 PM. Start taking on:  January 10, 2019 What changed:    medication strength  how much to take  how to take this  when to take this   sildenafil 20 MG tablet Commonly known as:  REVATIO Take 20-100 mg by mouth as needed (ED).      No Known Allergies    The results of significant diagnostics from this hospitalization (including imaging, microbiology, ancillary and laboratory) are listed below for reference.    Significant Diagnostic Studies: Dg Chest 2 View  Result Date: 01/09/2019 CLINICAL DATA:  TIA. EXAM: CHEST - 2 VIEW COMPARISON:  No recent prior. FINDINGS: Mediastinum and hilar structures normal. Solitary nodular opacities noted over both lung bases most likely nipple shadows. Confirmation with repeat PA and lateral chest x-ray with nipple markers suggested no focal infiltrate. Biapical pleural thickening noted consistent scarring. No pleural effusion or pneumothorax. Heart size normal. Degenerative change thoracic spine IMPRESSION: Solitary nodular opacities noted both lung bases most likely nipple shadows. Confirmation with repeat PA and lateral chest x-ray with nipple markers suggested. No acute cardiopulmonary disease noted. Electronically Signed   By: Marcello Moores  Register   On: 01/09/2019 06:39   Ct Head Wo Contrast  Result Date: 01/08/2019 CLINICAL DATA:  Right arm and leg numbness for 2 hours. EXAM: CT HEAD WITHOUT CONTRAST TECHNIQUE: Contiguous axial images were obtained from the base of the skull through the vertex without intravenous contrast. COMPARISON:  None. FINDINGS: Brain: There is no evidence for acute hemorrhage, hydrocephalus, mass lesion, or abnormal extra-axial fluid collection. No definite CT evidence for acute infarction. Diffuse loss of parenchymal volume is consistent with atrophy. Patchy  low attenuation in the deep hemispheric and periventricular white matter is nonspecific, but likely reflects chronic microvascular ischemic demyelination. Age-indeterminate lacunar infarct noted anterior limb right internal capsule. Vascular: No hyperdense vessel or unexpected calcification. Skull: No evidence for fracture. No worrisome lytic or sclerotic lesion. Sinuses/Orbits: The visualized paranasal sinuses and mastoid air cells are clear. Visualized portions of the globes and intraorbital fat are unremarkable. Other: None. IMPRESSION: 1. No acute intracranial abnormality. 2. Atrophy with chronic small vessel white matter ischemic disease. 3. 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 Electronically Signed   By: Misty Stanley M.D.   On: 01/08/2019 19:16   Mr Virgel Paling XT Contrast  Result Date: 01/09/2019 CLINICAL DATA:  Initial evaluation for acute episode of right-sided weakness/numbness, followed by complete resolution of symptoms. EXAM: MRI HEAD WITHOUT CONTRAST MRA HEAD WITHOUT CONTRAST TECHNIQUE: Multiplanar, multiecho pulse sequences of the brain and surrounding structures were obtained without intravenous contrast. Angiographic images of the head were obtained using MRA technique without contrast. COMPARISON:  Prior CT from 01/08/2019. FINDINGS: MRI HEAD FINDINGS Brain: Cerebral volume within normal limits for age. Patchy  T2/FLAIR hyperintensity within the periventricular deep white matter both cerebral hemispheres, nonspecific, but most like related chronic micro vessel ischemic disease double mild to moderate nature. No abnormal foci of restricted diffusion to suggest acute or subacute ischemia. Gray-white matter differentiation maintained. No encephalomalacia to suggest chronic cortical infarction. No evidence for acute or chronic intracranial hemorrhage. No mass lesion, midline shift or mass effect. No hydrocephalus. No extra-axial fluid collection. Pituitary gland  suprasellar region within normal limits. Midline structures intact. Vascular: Major intracranial vascular flow voids maintained. Skull and upper cervical spine: Craniocervical junction normal. Upper cervical spine within normal limits. Bone marrow signal intensity normal. No scalp soft tissue abnormality. Sinuses/Orbits: Globes orbital soft tissues within normal limits. Mild scattered mucosal thickening within the ethmoidal air cells. Paranasal sinuses are otherwise largely clear. No mastoid effusion. Inner ear structures normal. Other: None. MRA HEAD FINDINGS ANTERIOR CIRCULATION: Distal cervical segments of the internal carotid arteries are widely patent with symmetric antegrade flow. Petrous, cavernous, and supraclinoid segments widely patent without stenosis. A1 segments widely patent. Normal anterior communicating artery. Anterior cerebral arteries widely patent to their distal aspects. M1 segments widely patent without stenosis or occlusion. Normal MCA bifurcations. Distal MCA branches well perfused and symmetric. POSTERIOR CIRCULATION: Vertebral arteries widely patent to the vertebrobasilar junction without stenosis. Posterior inferior cerebral arteries not seen. Basilar widely patent to its distal aspect without stenosis. Superior cerebellar and posterior cerebral arteries widely patent bilaterally. No intracranial aneurysm. IMPRESSION: MRI HEAD IMPRESSION: 1. No acute intracranial infarct or other abnormality identified. 2. Mild-to-moderate cerebral white matter changes common nonspecific, but most like related to chronic microvascular ischemic disease. MRA HEAD IMPRESSION: Normal intracranial MRA. No large vessel occlusion. No hemodynamically significant or correctable stenosis. Electronically Signed   By: Jeannine Boga M.D.   On: 01/09/2019 03:52   Mr Brain Wo Contrast  Result Date: 01/09/2019 CLINICAL DATA:  Initial evaluation for acute episode of right-sided weakness/numbness, followed by  complete resolution of symptoms. EXAM: MRI HEAD WITHOUT CONTRAST MRA HEAD WITHOUT CONTRAST TECHNIQUE: Multiplanar, multiecho pulse sequences of the brain and surrounding structures were obtained without intravenous contrast. Angiographic images of the head were obtained using MRA technique without contrast. COMPARISON:  Prior CT from 01/08/2019. FINDINGS: MRI HEAD FINDINGS Brain: Cerebral volume within normal limits for age. Patchy T2/FLAIR hyperintensity within the periventricular deep white matter both cerebral hemispheres, nonspecific, but most like related chronic micro vessel ischemic disease double mild to moderate nature. No abnormal foci of restricted diffusion to suggest acute or subacute ischemia. Gray-white matter differentiation maintained. No encephalomalacia to suggest chronic cortical infarction. No evidence for acute or chronic intracranial hemorrhage. No mass lesion, midline shift or mass effect. No hydrocephalus. No extra-axial fluid collection. Pituitary gland suprasellar region within normal limits. Midline structures intact. Vascular: Major intracranial vascular flow voids maintained. Skull and upper cervical spine: Craniocervical junction normal. Upper cervical spine within normal limits. Bone marrow signal intensity normal. No scalp soft tissue abnormality. Sinuses/Orbits: Globes orbital soft tissues within normal limits. Mild scattered mucosal thickening within the ethmoidal air cells. Paranasal sinuses are otherwise largely clear. No mastoid effusion. Inner ear structures normal. Other: None. MRA HEAD FINDINGS ANTERIOR CIRCULATION: Distal cervical segments of the internal carotid arteries are widely patent with symmetric antegrade flow. Petrous, cavernous, and supraclinoid segments widely patent without stenosis. A1 segments widely patent. Normal anterior communicating artery. Anterior cerebral arteries widely patent to their distal aspects. M1 segments widely patent without stenosis or  occlusion. Normal MCA bifurcations. Distal MCA branches well perfused and  symmetric. POSTERIOR CIRCULATION: Vertebral arteries widely patent to the vertebrobasilar junction without stenosis. Posterior inferior cerebral arteries not seen. Basilar widely patent to its distal aspect without stenosis. Superior cerebellar and posterior cerebral arteries widely patent bilaterally. No intracranial aneurysm. IMPRESSION: MRI HEAD IMPRESSION: 1. No acute intracranial infarct or other abnormality identified. 2. Mild-to-moderate cerebral white matter changes common nonspecific, but most like related to chronic microvascular ischemic disease. MRA HEAD IMPRESSION: Normal intracranial MRA. No large vessel occlusion. No hemodynamically significant or correctable stenosis. Electronically Signed   By: Jeannine Boga M.D.   On: 01/09/2019 03:52   Vas US Carotid (at [place] Only)  Result Date: 01/09/2019 Carotid Arterial Duplex Study Indications: CVA. Performing Technologist: June Leap RDMS, RVT  Examination Guidelines: A complete evaluation includes B-mode imaging, spectral Doppler, color Doppler, and power Doppler as needed of all accessible portions of each vessel. Bilateral testing is considered an integral part of a complete examination. Limited examinations for reoccurring indications may be performed as noted.  Right Carotid Findings: +----------+--------+--------+--------+--------+------------------+           PSV cm/sEDV cm/sStenosisDescribeComments           +----------+--------+--------+--------+--------+------------------+ CCA Prox  99      14                                         +----------+--------+--------+--------+--------+------------------+ CCA Distal93      18                                         +----------+--------+--------+--------+--------+------------------+ ICA Prox  46      10      1-39%           intimal thickening  +----------+--------+--------+--------+--------+------------------+ ICA Distal90      30                                         +----------+--------+--------+--------+--------+------------------+ ECA       80      16                                         +----------+--------+--------+--------+--------+------------------+ +----------+--------+-------+--------+-------------------+           PSV cm/sEDV cmsDescribeArm Pressure (mmHG) +----------+--------+-------+--------+-------------------+ [id number]            Stenotic                    +----------+--------+-------+--------+-------------------+ +---------+--------+--+--------+-+---------+ VertebralPSV cm/s48EDV cm/s9Antegrade +---------+--------+--+--------+-+---------+  Left Carotid Findings: +----------+--------+--------+--------+--------+--------+           PSV cm/sEDV cm/sStenosisDescribeComments +----------+--------+--------+--------+--------+--------+ CCA Prox  113     19                               +----------+--------+--------+--------+--------+--------+ CCA Distal93      14                               +----------+--------+--------+--------+--------+--------+ ICA Prox  61      14  Normal                   +----------+--------+--------+--------+--------+--------+ ICA Distal98      21                               +----------+--------+--------+--------+--------+--------+ ECA       96      11                               +----------+--------+--------+--------+--------+--------+ +----------+--------+--------+----------------+-------------------+ SubclavianPSV cm/sEDV cm/sDescribe        Arm Pressure (mmHG) +----------+--------+--------+----------------+-------------------+           158             Multiphasic, WNL                    +----------+--------+--------+----------------+-------------------+ +---------+--------+--+--------+--+---------+ VertebralPSV  cm/s53EDV cm/s16Antegrade +---------+--------+--+--------+--+---------+  Summary: Right Carotid: Velocities in the right ICA are consistent with a 1-39% stenosis. Left Carotid: The extracranial vessels were near-normal with only minimal wall               thickening or plaque.  *See table(s) above for measurements and observations.     Preliminary     Microbiology: No results found for this or any previous visit (from the past 240 hour(s)).   Labs: Basic Metabolic Panel: Recent Labs  Lab 01/08/19 1844  NA 137  K 3.8  CL 106  CO2 24  GLUCOSE 88  BUN 19  CREATININE 0.92  CALCIUM 9.6   Liver Function Tests: Recent Labs  Lab 01/08/19 1844  AST 31  ALT 39  ALKPHOS 49  BILITOT 0.5  PROT 7.8  ALBUMIN 4.5   No results for input(s): LIPASE, AMYLASE in the last 168 hours. No results for input(s): AMMONIA in the last 168 hours. CBC: Recent Labs  Lab 01/08/19 1844  WBC 6.5  NEUTROABS 3.8  HGB 15.8  HCT 49.8  MCV 100.0  PLT 193   Cardiac Enzymes: No results for input(s): CKTOTAL, CKMB, CKMBINDEX, TROPONINI in the last 168 hours. BNP: BNP (last 3 results) No results for input(s): BNP in the last 8760 hours.  ProBNP (last 3 results) No results for input(s): PROBNP in the last 8760 hours.  CBG: No results for input(s): GLUCAP in the last 168 hours.     Signed:  Radene Gunning NP.  [facility] 01/09/2019, 2:59 PM

## 2019-01-09 NOTE — Progress Notes (Signed)
Admitted after midnight for tia. Hx htn hyperlipidemia. Mother had stroke at younger age. Symptoms resolved after few minutes. No reoccurence of symptoms. Evaluated by neuro who recommend tia work up.  A/P  1. TIA - right sided weakness. Resolved after few minutes and no recurrence. Mri without acute intracranial infarct or other abnormality, mild to moderate cerebral white matter changes non-specific. MRA reveals vertebral arteries widely patent to the vertebrobasilar junction without stenosis. Posterior inferior cerebral arteries not seen. Basilar widely patent. Superior cerebellar and posterior cerebral arteries widely patent bilaterally. A1c 5.4. lipid panel with LDL 125. OT with no recommendations.  Evaluated by neuro who recommends starting aspirin and plavix 1. TIA pathway and work up 2. Neuro consult 3. ASA 81 and plavix 75mg  daily for 3 weeks per neuro 4. 2d echo 5. Carotid dopplers 6. PT 7. Tele monitor 2. HLD - Continue statin at higher dose per neuro 3. HTN - fair control. Home meds include lisinopril. Holding lisinopril for now  Santiago Glad m. Kaeya Schiffer, NP

## 2019-01-09 NOTE — Progress Notes (Signed)
Pt given all discharge instructions and verbalized understanding.  Pt understands all discharge meds and info with the schedule of his plavix x 3wks and then only aspirin. Pt is aware of his f/u appt to schedule. Pt discharges home with wife and all belongings.

## 2019-01-09 NOTE — Progress Notes (Signed)
SLP Cancellation Note  Patient Details Name: Thomas Bass MRN: [id number] DOB: 1951/09/22   Cancelled treatment:       Reason Eval/Treat Not Completed: SLP screened, no needs identified, will sign off. Per pt and family the pt is currently at baseline with regards to speech, language and cognition and no deficits were noted in these areas by occupational therapy.   Starla Deller I. Hardin Negus, [place], [place] Office number [phone] Pager [place] 01/09/2019, 10:55 AM

## 2019-02-10 ENCOUNTER — Telehealth: Payer: Self-pay

## 2019-02-10 NOTE — Telephone Encounter (Signed)
I spoke pt that his visit will be change to video visit due to COVID 19. I stated we are not doing face to face visit. Pt states he has a iphone. He gave me his email address. I advise pt to download [email or web address] on his cell phone as a appt. I also stated he will get a email 2 to 3 days prior to his visit to log on and join meeting. He gave verbal consent to do video and file insurance. I also updated and reviewed his medication list. Pt verbalized understanding.

## 2019-02-13 NOTE — Telephone Encounter (Signed)
Link was sent to email for visit on 02/13/2019 at 838am for visit on 02/17/2019.

## 2019-02-13 NOTE — Progress Notes (Signed)
[facility] [address] [place]. [place] [address] [phone]       HOSPITAL FOLLOW UP NOTE  [name] Date of Birth:  Sep 19, 1951 Medical Record Number:  [id number]   Reason for Referral:  hospital stroke follow up  Virtual Visit via Telephone Note  I connected with Thomas Bass on 02/17/19 at  9:15 AM EDT by telephone located remotely within my own home and verified that I am speaking with the correct person using two identifiers who reports being located within his own home.    Initially scheduled for telemedicine via WebEx the patient had difficulty downloading software in order to adequately participate in visit.  Visit transition to telemedicine via telephone visit at that time.  I discussed the limitations, risks, security and privacy concerns of performing an evaluation and management service by telephone and the availability of in person appointments. I also discussed with the patient that there may be a patient responsible charge related to this service. The patient expressed understanding with verbal consent and agreed to proceed.    CHIEF COMPLAINT:  Chief Complaint  Patient presents with  . Follow-up    hospital follow up - doing well, no concerns    HPI: [name] was initially scheduled today for in office hospital follow-up regarding left brain TIA on 01/08/2019 but due to COVID-19 safety precautions, visit transition to telemedicine via WebEx but due to patient's difficulty with software in order to adequately perform visit, transitioned to telephone visit.  History obtained from patient and chart review. Reviewed all radiology images and labs personally.  [name] is a 68 y.o. male with history of anxiety, HTN, HLD  who presented with transient R arm > leg weakness.   CT head and MRI reviewed which was negative for acute infarct.  MRA normal.  Carotid Doppler unremarkable.  2D echo unremarkable.  LDL 125 (increased  rosuvastatin dose to 40 mg daily) and A1c 5.4.  HTN stable.  Recommended DAPT for 3 weeks and aspirin alone.  Other stroke risk factors include advanced age, EtOH use, obesity, family history of stroke and OSA on CPAP at home.  Discharged home in stable condition.  He has been stable since discharge without any recurrent symptoms or new stroke/TIA symptoms.  He has returned back to all prior activities without difficulty.  Continues on increased dose of rosuvastatin 40mg  daily without myalgias. Appt with PCP next month and will have lab work obtained.  Completed 3 weeks DAPT and continues on aspirin alone without bleeding or bruising. Does not monitor BP at home is typically stable . Ongoing compliance with CPAP for OSA management.  No further concerns at this time.  Denies new or worsening stroke/TIA symptoms.     PMH:  Past Medical History:  Diagnosis Date  . Anxiety   . High cholesterol   . Hypertension     PSH:  Past Surgical History:  Procedure Laterality Date  . MENISCUS REPAIR Right 2019    Social History:  Social History   Socioeconomic History  . Marital status: Married    Spouse name: Not on file  . Number of children: Not on file  . Years of education: Not on file  . Highest education level: Not on file  Occupational History  . Not on file  Social Needs  . Financial resource strain: Not on file  . Food insecurity:    Worry: Not on file    Inability: Not on file  .  Transportation needs:    Medical: Not on file    Non-medical: Not on file  Tobacco Use  . Smoking status: Never Smoker  . Smokeless tobacco: Never Used  Substance and Sexual Activity  . Alcohol use: Yes    Comment: weekly  . Drug use: Not on file  . Sexual activity: Not on file  Lifestyle  . Physical activity:    Days per week: Not on file    Minutes per session: Not on file  . Stress: Not on file  Relationships  . Social connections:    Talks on phone: Not on file    Gets together: Not on  file    Attends religious service: Not on file    Active member of club or organization: Not on file    Attends meetings of clubs or organizations: Not on file    Relationship status: Not on file  . Intimate partner violence:    Fear of current or ex partner: Not on file    Emotionally abused: Not on file    Physically abused: Not on file    Forced sexual activity: Not on file  Other Topics Concern  . Not on file  Social History Narrative  . Not on file    Family History:  Family History  Problem Relation Age of Onset  . Stroke Mother     Medications:   Current Outpatient Medications on File Prior to Visit  Medication Sig Dispense Refill  . aspirin EC 81 MG EC tablet Take 1 tablet (81 mg total) by mouth daily.    Marland Kitchen lisinopril (PRINIVIL,ZESTRIL) 10 MG tablet Take 10 mg by mouth daily.    . mirtazapine (REMERON) 7.5 MG tablet Take 7.5 mg by mouth at bedtime.    . naproxen sodium (ALEVE) 220 MG tablet Take 220-440 mg by mouth daily as needed (pain).    . rosuvastatin (CRESTOR) 40 MG tablet Take 1 tablet (40 mg total) by mouth daily at 6 PM. 30 tablet 1  . sildenafil (REVATIO) 20 MG tablet Take 20-100 mg by mouth as needed (ED).     No current facility-administered medications on file prior to visit.     Allergies:  No Known Allergies   Observations/Objective:   Physical Exam Limited exam due to visit type  Depression screen [facility] 2/9 02/17/2019  Decreased Interest 0  Down, Depressed, Hopeless 0  PHQ - 2 Score 0     General: Pleasant middle-aged Caucasian male asking and answering questions appropriately   Neurologic Exam Mental Status: Awake and fully alert. Oriented to place and time. Recent and remote memory intact. Attention span, concentration and fund of knowledge appropriate. Mood and affect appropriate.   Subjectively denies residual deficits   NIHSS: Unable to perform due to visit type MRS: 0   Diagnostic Data (Labs, Imaging, Testing)  CT HEAD WO  CONTRAST 01/08/2019 IMPRESSION: 1. No acute intracranial abnormality. 2. Atrophy with chronic small vessel white matter ischemic disease. 3. 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  MR BRAIN WO CONTRAST MR MRA HEAD  01/09/2019 IMPRESSION: MRI HEAD IMPRESSION:  1. No acute intracranial infarct or other abnormality identified. 2. Mild-to-moderate cerebral white matter changes common nonspecific, but most like related to chronic microvascular ischemic disease.  MRA HEAD IMPRESSION:  Normal intracranial MRA. No large vessel occlusion. No hemodynamically significant or correctable stenosis.  ECHOCARDIOGRAM 01/09/2019 IMPRESSIONS  1. The left ventricle has normal systolic function, with an ejection fraction of 55-60%. The cavity size  was normal. Left ventricular diastolic Doppler parameters are consistent with impaired relaxation. Indeterminate filling pressures The E/e' is 8-15.  2. The right ventricle has mildly reduced systolic function. The cavity was mildly enlarged. There is no increase in right ventricular wall thickness.  3. Right atrial size was mildly dilated.  4. No stenosis of the aortic valve.  5. The aortic root and ascending aorta are normal in size and structure.    ASSESSMENT: MCCLAIN SHALL is a 68 y.o. year old male here with left brain TIA on 01/08/2019. Vascular risk factors include HTN, HLD, EtOH use, OSA on CPAP and family history of stroke.  He has been stable from a stroke standpoint without recurring or new stroke/TIA symptoms.    PLAN:  1. Left brain TIA: Continue aspirin 81 mg daily  and rosuvastatin 40 mg for secondary stroke prevention. Maintain strict control of hypertension with blood pressure goal below 130/90, diabetes with hemoglobin A1c goal below 6.5% and cholesterol with LDL cholesterol (bad cholesterol) goal below 70 mg/dL.  I also advised the patient to eat a healthy diet with plenty of whole grains, cereals,  fruits and vegetables, exercise regularly with at least 30 minutes of continuous activity daily and maintain ideal body weight. 2. HTN: Advised to continue current treatment regimen. Advised to continue to monitor at home along with continued follow-up with PCP for management 3. HLD: Advised to continue current treatment regimen along with continued follow-up with PCP for future prescribing and monitoring of lipid panel 4. OSA on CPAP: Encouraged ongoing compliance    Follow up in 6 months or call earlier if needed    Discussion regarding diagnosis of TIA and importance of risk factor management including HTN, HLD and OSA on CPAP.  The patient was provided an opportunity to ask questions and all were answered to their satisfaction. The patient agreed with the plan and verbalized an understanding of the instructions.   I provided 26 minutes of non-face-to-face time during this encounter.    Venancio Poisson, AGNP-BC  [facility] [address] [place], [place] [address]  Phone [phone] Fax [phone] Note: This document was prepared with digital dictation and possible smart phrase technology. Any transcriptional errors that result from this process are unintentional.

## 2019-02-17 ENCOUNTER — Encounter: Payer: Self-pay | Admitting: Adult Health

## 2019-02-17 ENCOUNTER — Other Ambulatory Visit: Payer: Self-pay

## 2019-02-17 ENCOUNTER — Ambulatory Visit (INDEPENDENT_AMBULATORY_CARE_PROVIDER_SITE_OTHER): Payer: Medicare HMO | Admitting: Adult Health

## 2019-02-17 DIAGNOSIS — G459 Transient cerebral ischemic attack, unspecified: Secondary | ICD-10-CM | POA: Diagnosis not present

## 2019-02-17 DIAGNOSIS — Z9989 Dependence on other enabling machines and devices: Secondary | ICD-10-CM

## 2019-02-17 DIAGNOSIS — I10 Essential (primary) hypertension: Secondary | ICD-10-CM

## 2019-02-17 DIAGNOSIS — G4733 Obstructive sleep apnea (adult) (pediatric): Secondary | ICD-10-CM | POA: Diagnosis not present

## 2019-02-17 DIAGNOSIS — E785 Hyperlipidemia, unspecified: Secondary | ICD-10-CM

## 2019-02-17 NOTE — Progress Notes (Signed)
I agree with the above plan 

## 2019-03-20 DIAGNOSIS — I10 Essential (primary) hypertension: Secondary | ICD-10-CM | POA: Diagnosis not present

## 2019-03-20 DIAGNOSIS — E785 Hyperlipidemia, unspecified: Secondary | ICD-10-CM | POA: Diagnosis not present

## 2019-03-20 DIAGNOSIS — Z125 Encounter for screening for malignant neoplasm of prostate: Secondary | ICD-10-CM | POA: Diagnosis not present

## 2019-03-20 DIAGNOSIS — Z1159 Encounter for screening for other viral diseases: Secondary | ICD-10-CM | POA: Diagnosis not present

## 2019-03-24 DIAGNOSIS — R972 Elevated prostate specific antigen [PSA]: Secondary | ICD-10-CM | POA: Diagnosis not present

## 2019-03-24 DIAGNOSIS — Z6831 Body mass index (BMI) 31.0-31.9, adult: Secondary | ICD-10-CM | POA: Diagnosis not present

## 2019-03-24 DIAGNOSIS — R69 Illness, unspecified: Secondary | ICD-10-CM | POA: Diagnosis not present

## 2019-03-24 DIAGNOSIS — N529 Male erectile dysfunction, unspecified: Secondary | ICD-10-CM | POA: Diagnosis not present

## 2019-03-24 DIAGNOSIS — Z0000 Encounter for general adult medical examination without abnormal findings: Secondary | ICD-10-CM | POA: Diagnosis not present

## 2019-03-24 DIAGNOSIS — K429 Umbilical hernia without obstruction or gangrene: Secondary | ICD-10-CM | POA: Diagnosis not present

## 2019-03-24 DIAGNOSIS — R0989 Other specified symptoms and signs involving the circulatory and respiratory systems: Secondary | ICD-10-CM | POA: Diagnosis not present

## 2019-03-24 DIAGNOSIS — Z1212 Encounter for screening for malignant neoplasm of rectum: Secondary | ICD-10-CM | POA: Diagnosis not present

## 2019-03-24 DIAGNOSIS — N400 Enlarged prostate without lower urinary tract symptoms: Secondary | ICD-10-CM | POA: Diagnosis not present

## 2019-03-24 DIAGNOSIS — I10 Essential (primary) hypertension: Secondary | ICD-10-CM | POA: Diagnosis not present

## 2019-03-24 DIAGNOSIS — E785 Hyperlipidemia, unspecified: Secondary | ICD-10-CM | POA: Diagnosis not present

## 2019-04-10 DIAGNOSIS — L281 Prurigo nodularis: Secondary | ICD-10-CM | POA: Diagnosis not present

## 2019-04-10 DIAGNOSIS — D225 Melanocytic nevi of trunk: Secondary | ICD-10-CM | POA: Diagnosis not present

## 2019-04-10 DIAGNOSIS — L821 Other seborrheic keratosis: Secondary | ICD-10-CM | POA: Diagnosis not present

## 2019-04-10 DIAGNOSIS — Z85828 Personal history of other malignant neoplasm of skin: Secondary | ICD-10-CM | POA: Diagnosis not present

## 2019-04-10 DIAGNOSIS — L570 Actinic keratosis: Secondary | ICD-10-CM | POA: Diagnosis not present

## 2019-04-10 DIAGNOSIS — L738 Other specified follicular disorders: Secondary | ICD-10-CM | POA: Diagnosis not present

## 2019-04-10 DIAGNOSIS — L814 Other melanin hyperpigmentation: Secondary | ICD-10-CM | POA: Diagnosis not present

## 2019-04-19 IMAGING — CR CHEST - 2 VIEW
2 series · 2 of 2 positions shown · non-contrast
Comparison: No recent prior.

CLINICAL DATA: TIA.

EXAM:
CHEST - 2 VIEW

[chest pa]
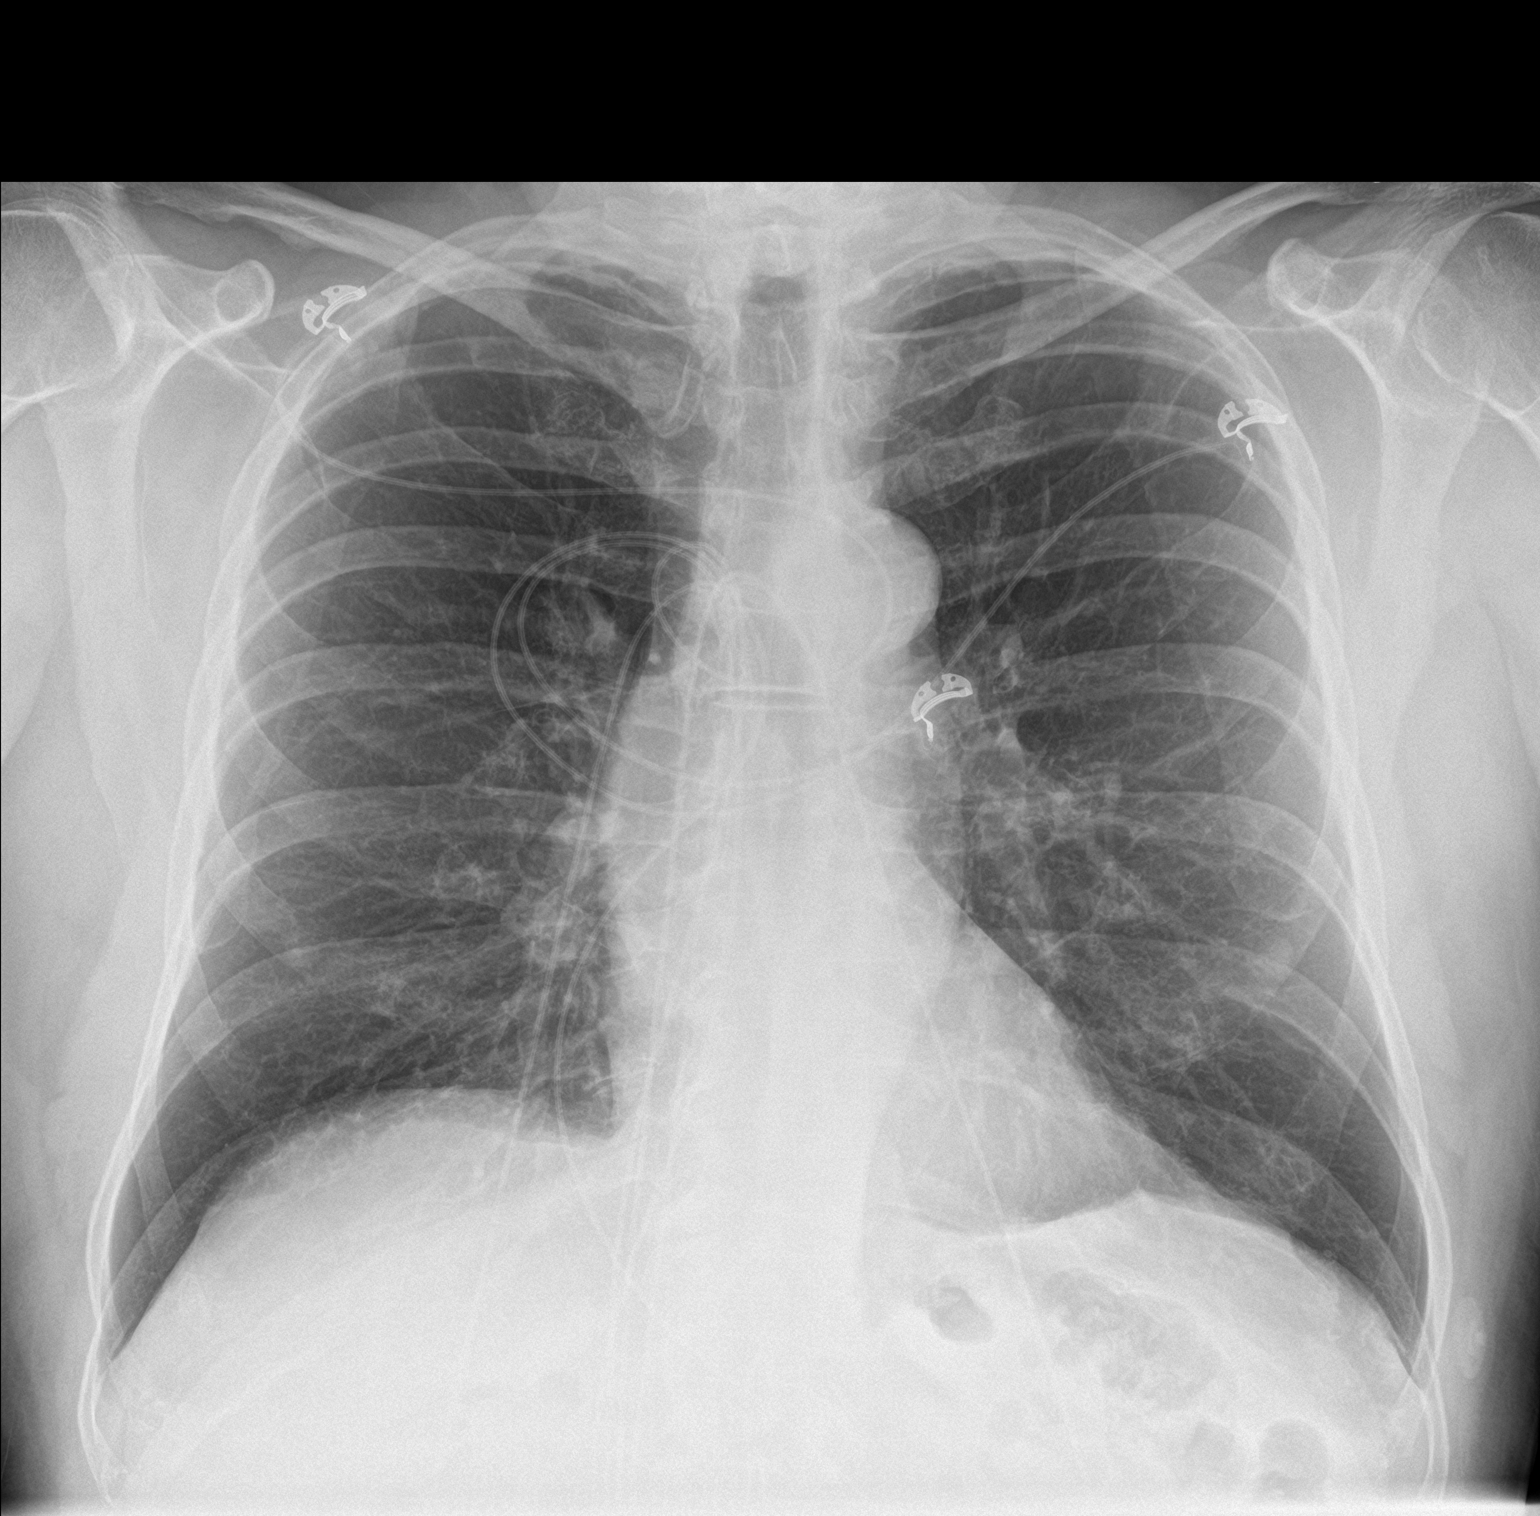

[chest lat]
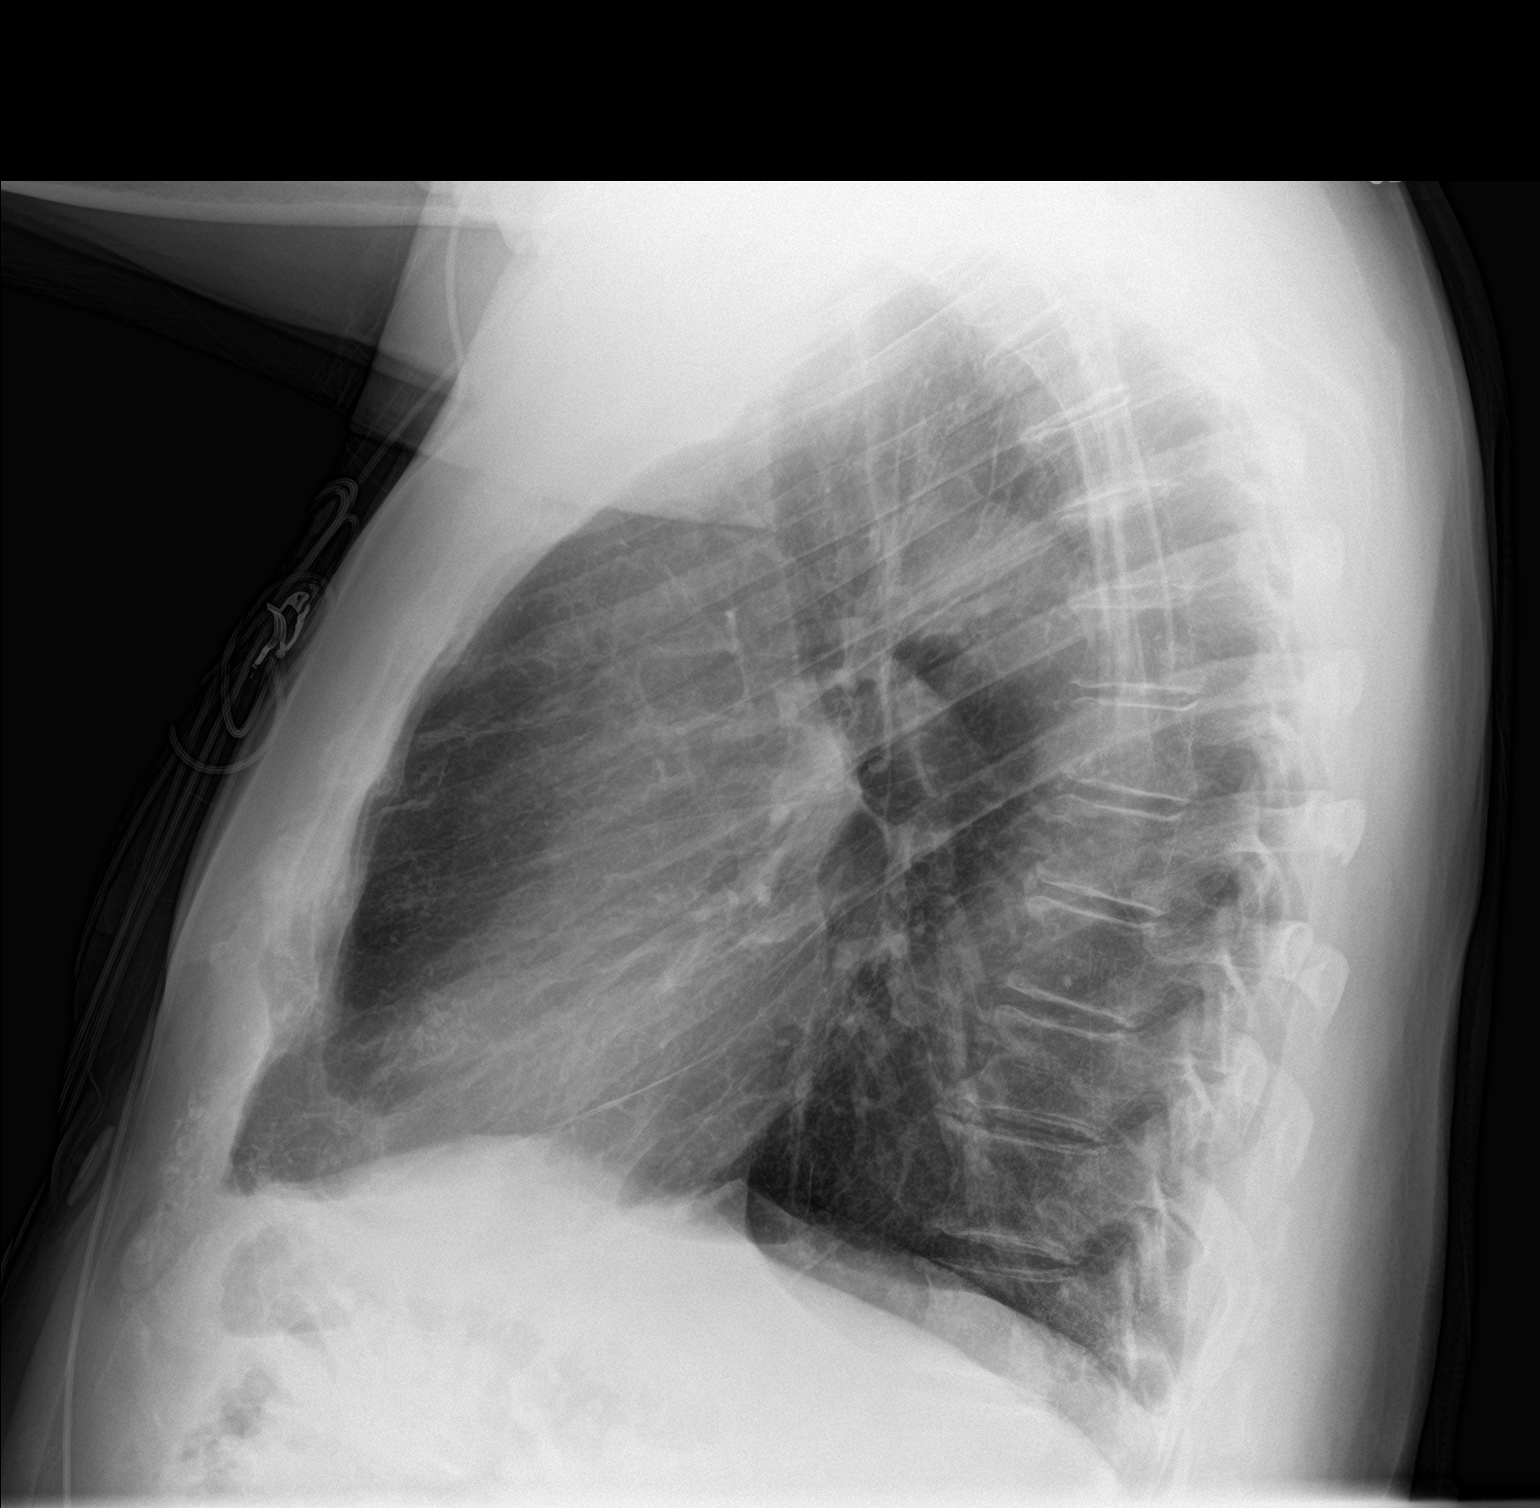

[2 of 2 positions shown; findings below may reference images not displayed]

FINDINGS: Mediastinum and hilar structures normal. Solitary nodular opacities
noted over both lung bases most likely nipple shadows. Confirmation
with repeat PA and lateral chest x-ray with nipple markers suggested
no focal infiltrate. Biapical pleural thickening noted consistent
scarring. No pleural effusion or pneumothorax. Heart size normal.
Degenerative change thoracic spine
IMPRESSION: Solitary nodular opacities noted both lung bases most likely nipple
shadows. Confirmation with repeat PA and lateral chest x-ray with
nipple markers suggested. No acute cardiopulmonary disease noted.

## 2019-05-13 DIAGNOSIS — W57XXXA Bitten or stung by nonvenomous insect and other nonvenomous arthropods, initial encounter: Secondary | ICD-10-CM | POA: Diagnosis not present

## 2019-05-13 DIAGNOSIS — L03115 Cellulitis of right lower limb: Secondary | ICD-10-CM | POA: Diagnosis not present

## 2019-07-22 DIAGNOSIS — Z23 Encounter for immunization: Secondary | ICD-10-CM | POA: Diagnosis not present

## 2019-08-20 ENCOUNTER — Ambulatory Visit: Payer: Medicare HMO | Admitting: Adult Health

## 2019-08-20 ENCOUNTER — Encounter: Payer: Self-pay | Admitting: Adult Health

## 2019-08-20 ENCOUNTER — Other Ambulatory Visit: Payer: Self-pay

## 2019-08-20 VITALS — BP 134/85 | HR 66 | Temp 97.3°F | Ht 70.0 in | Wt 227.8 lb

## 2019-08-20 DIAGNOSIS — Z9989 Dependence on other enabling machines and devices: Secondary | ICD-10-CM

## 2019-08-20 DIAGNOSIS — G459 Transient cerebral ischemic attack, unspecified: Secondary | ICD-10-CM | POA: Diagnosis not present

## 2019-08-20 DIAGNOSIS — E785 Hyperlipidemia, unspecified: Secondary | ICD-10-CM | POA: Diagnosis not present

## 2019-08-20 DIAGNOSIS — G4733 Obstructive sleep apnea (adult) (pediatric): Secondary | ICD-10-CM | POA: Diagnosis not present

## 2019-08-20 DIAGNOSIS — I10 Essential (primary) hypertension: Secondary | ICD-10-CM

## 2019-08-20 NOTE — Patient Instructions (Signed)
Continue aspirin 81 mg daily  and Crestor for secondary stroke prevention  Continue to follow up with PCP regarding cholesterol and blood pressure management   Continue to monitor blood pressure at home  Maintain strict control of hypertension with blood pressure goal below 130/90, diabetes with hemoglobin A1c goal below 6.5% and cholesterol with LDL cholesterol (bad cholesterol) goal below 70 mg/dL. I also advised the patient to eat a healthy diet with plenty of whole grains, cereals, fruits and vegetables, exercise regularly and maintain ideal body weight.         Thank you for coming to see us at [facility]. I hope we have been able to provide you high quality care today.  You may receive a patient satisfaction survey over the next few weeks. We would appreciate your feedback and comments so that we may continue to improve ourselves and the health of our patients.  

## 2019-08-20 NOTE — Progress Notes (Signed)
[facility] [address] [place]. [place] [address] [phone]       OFFICE FOLLOW UP NOTE  [name] Date of Birth:  Jul 13, 1951 Medical Record Number:  [id number]   Reason for visit: TIA 12/2018   CHIEF COMPLAINT:  Chief Complaint  Patient presents with   Follow-up    6 mon f/u. Wife present. Treatment room. No new concerns at this time.     HPI: Stroke admission 01/08/2019: [name] is a 68 y.o. male with history of anxiety, HTN, HLD  who presented with transient R arm > leg weakness.   CT head and MRI reviewed which was negative for acute infarct.  MRA normal.  Carotid Doppler unremarkable.  2D echo unremarkable.  LDL 125 (increased rosuvastatin dose to 40 mg daily) and A1c 5.4.  HTN stable.  Recommended DAPT for 3 weeks and aspirin alone.  Other stroke risk factors include advanced age, EtOH use, obesity, family history of stroke and OSA on CPAP at home.  Discharged home in stable condition.  Virtual visit 02/17/2019: He has been stable since discharge without any recurrent symptoms or new stroke/TIA symptoms.  He has returned back to all prior activities without difficulty.  Continues on increased dose of rosuvastatin 40mg  daily without myalgias. Appt with PCP next month and will have lab work obtained.  Completed 3 weeks DAPT and continues on aspirin alone without bleeding or bruising. Does not monitor BP at home is typically stable . Ongoing compliance with CPAP for OSA management.  No further concerns at this time.  Denies new or worsening stroke/TIA symptoms.  Update 08/20/2019: [name] is being seen today for TIA follow-up accompanied by his wife.  He has been doing well since prior visit without reoccurring or new stroke/TIA symptoms.  Continues on aspirin and Crestor for secondary stroke prevention without side effects.  Blood pressure today 134/85.  He does monitor at home and typically 120s/80s.  Endorses compliance with CPAP for OSA  management.  No further glucose at this time.  ROS: Review of Systems  Constitutional: Negative.   HENT: Negative.   Eyes: Negative.   Respiratory: Negative.   Cardiovascular: Negative.   Gastrointestinal: Negative.   Genitourinary: Negative.   Musculoskeletal: Positive for joint pain (left shoulder - chronic).  Skin: Negative.   Neurological: Negative for headaches ().  Endo/Heme/Allergies: Negative.   Psychiatric/Behavioral: Negative.      PMH:  Past Medical History:  Diagnosis Date   Anxiety    High cholesterol    Hypertension     PSH:  Past Surgical History:  Procedure Laterality Date   MENISCUS REPAIR Right 2019    Social History:  Social History   Socioeconomic History   Marital status: Married    Spouse name: Not on file   Number of children: Not on file   Years of education: Not on file   Highest education level: Not on file  Occupational History   Not on file  Social Needs   Financial resource strain: Not on file   Food insecurity    Worry: Not on file    Inability: Not on file   Transportation needs    Medical: Not on file    Non-medical: Not on file  Tobacco Use   Smoking status: Never Smoker   Smokeless tobacco: Never Used  Substance and Sexual Activity   Alcohol use: Yes    Comment: weekly   Drug use: Not on file   Sexual  activity: Not on file  Lifestyle   Physical activity    Days per week: Not on file    Minutes per session: Not on file   Stress: Not on file  Relationships   Social connections    Talks on phone: Not on file    Gets together: Not on file    Attends religious service: Not on file    Active member of club or organization: Not on file    Attends meetings of clubs or organizations: Not on file    Relationship status: Not on file   Intimate partner violence    Fear of current or ex partner: Not on file    Emotionally abused: Not on file    Physically abused: Not on file    Forced sexual  activity: Not on file  Other Topics Concern   Not on file  Social History Narrative   Not on file    Family History:  Family History  Problem Relation Age of Onset   Stroke Mother     Medications:   Current Outpatient Medications on File Prior to Visit  Medication Sig Dispense Refill   aspirin EC 81 MG EC tablet Take 1 tablet (81 mg total) by mouth daily.     lisinopril (PRINIVIL,ZESTRIL) 10 MG tablet Take 10 mg by mouth daily.     mirtazapine (REMERON) 7.5 MG tablet Take 7.5 mg by mouth at bedtime.     naproxen sodium (ALEVE) 220 MG tablet Take 220-440 mg by mouth daily as needed (pain).     rosuvastatin (CRESTOR) 40 MG tablet Take 1 tablet (40 mg total) by mouth daily at 6 PM. 30 tablet 1   sildenafil (REVATIO) 20 MG tablet Take 20-100 mg by mouth as needed (ED).     No current facility-administered medications on file prior to visit.     Allergies:  No Known Allergies   Physical Exam   Today's Vitals   08/20/19 0935  BP: 134/85  Pulse: 66  Temp: (!) 97.3 F (36.3 C)  TempSrc: Oral  Weight: 227 lb 12.8 oz (103.3 kg)  Height: 5\' 10"  (1.778 m)   Body mass index is 32.69 kg/m.   General: well developed, well nourished, pleasant middle-age Caucasian male, seated, in no evident distress Head: head normocephalic and atraumatic.   Neck: supple with no carotid or supraclavicular bruits Cardiovascular: regular rate and rhythm, no murmurs Musculoskeletal: no deformity Skin:  no rash/petichiae Vascular:  Normal pulses all extremities   Neurologic Exam Mental Status: Awake and fully alert. Oriented to place and time. Recent and remote memory intact. Attention span, concentration and fund of knowledge appropriate. Mood and affect appropriate.  Cranial Nerves: Pupils equal, briskly reactive to light. Extraocular movements full without nystagmus. Visual fields full to confrontation. Hearing intact. Facial sensation intact. Face, tongue, palate moves normally and  symmetrically.  Motor: Normal bulk and tone. Normal strength in all tested extremity muscles. Sensory.: intact to touch , pinprick , position and vibratory sensation.  Coordination: Rapid alternating movements normal in all extremities. Finger-to-nose and heel-to-shin performed accurately bilaterally. Gait and Station: Arises from chair without difficulty. Stance is normal. Gait demonstrates normal stride length and balance Reflexes: 1+ and symmetric. Toes downgoing.      Diagnostic Data (Labs, Imaging, Testing)  CT HEAD WO CONTRAST 01/08/2019 IMPRESSION: 1. No acute intracranial abnormality. 2. Atrophy with chronic small vessel white matter ischemic disease. 3. 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  MR BRAIN WO CONTRAST  MR MRA HEAD  01/09/2019 IMPRESSION: MRI HEAD IMPRESSION:  1. No acute intracranial infarct or other abnormality identified. 2. Mild-to-moderate cerebral white matter changes common nonspecific, but most like related to chronic microvascular ischemic disease.  MRA HEAD IMPRESSION:  Normal intracranial MRA. No large vessel occlusion. No hemodynamically significant or correctable stenosis.  ECHOCARDIOGRAM 01/09/2019 IMPRESSIONS  1. The left ventricle has normal systolic function, with an ejection fraction of 55-60%. The cavity size was normal. Left ventricular diastolic Doppler parameters are consistent with impaired relaxation. Indeterminate filling pressures The E/e' is 8-15.  2. The right ventricle has mildly reduced systolic function. The cavity was mildly enlarged. There is no increase in right ventricular wall thickness.  3. Right atrial size was mildly dilated.  4. No stenosis of the aortic valve.  5. The aortic root and ascending aorta are normal in size and structure.    ASSESSMENT: Thomas Bass is a 68 y.o. year old male here with left brain TIA on 01/08/2019. Vascular risk factors include HTN, HLD, EtOH use,  OSA on CPAP and family history of stroke.  He has been stable from a stroke standpoint without recurring or new stroke/TIA symptoms.    PLAN:  1. Left brain TIA: Continue aspirin 81 mg daily  and rosuvastatin 40 mg for secondary stroke prevention. Maintain strict control of hypertension with blood pressure goal below 130/90, diabetes with hemoglobin A1c goal below 6.5% and cholesterol with LDL cholesterol (bad cholesterol) goal below 70 mg/dL.  I also advised the patient to eat a healthy diet with plenty of whole grains, cereals, fruits and vegetables, exercise regularly with at least 30 minutes of continuous activity daily and maintain ideal body weight. 2. HTN: Advised to continue current treatment regimen. Advised to continue to monitor at home along with continued follow-up with PCP for management 3. HLD: Advised to continue current treatment regimen along with continued follow-up with PCP for future prescribing and monitoring of lipid panel 4. OSA on CPAP: Encouraged ongoing compliance   Overall stable from stroke standpoint recommend follow-up as needed  Greater than 50% of time during this 25 minute visit was spent on counseling,explanation of diagnosis of TIA, reviewing risk factor management of HTN, HLD and OSA, planning of further management, discussion with patient and family and coordination of care   Frann Rider, [facility]  [facility] [address] [place], [place] [address]  Phone [phone] Fax [phone] Note: This document was prepared with digital dictation and possible smart phrase technology. Any transcriptional errors that result from this process are unintentional.

## 2019-08-21 NOTE — Progress Notes (Signed)
I agree with the above plan 

## 2020-03-24 DIAGNOSIS — E785 Hyperlipidemia, unspecified: Secondary | ICD-10-CM | POA: Diagnosis not present

## 2020-03-24 DIAGNOSIS — Z125 Encounter for screening for malignant neoplasm of prostate: Secondary | ICD-10-CM | POA: Diagnosis not present

## 2020-03-29 DIAGNOSIS — E785 Hyperlipidemia, unspecified: Secondary | ICD-10-CM | POA: Diagnosis not present

## 2020-03-29 DIAGNOSIS — I456 Pre-excitation syndrome: Secondary | ICD-10-CM | POA: Diagnosis not present

## 2020-03-29 DIAGNOSIS — R972 Elevated prostate specific antigen [PSA]: Secondary | ICD-10-CM | POA: Diagnosis not present

## 2020-03-29 DIAGNOSIS — M722 Plantar fascial fibromatosis: Secondary | ICD-10-CM | POA: Diagnosis not present

## 2020-03-29 DIAGNOSIS — R69 Illness, unspecified: Secondary | ICD-10-CM | POA: Diagnosis not present

## 2020-03-29 DIAGNOSIS — N529 Male erectile dysfunction, unspecified: Secondary | ICD-10-CM | POA: Diagnosis not present

## 2020-03-29 DIAGNOSIS — K429 Umbilical hernia without obstruction or gangrene: Secondary | ICD-10-CM | POA: Diagnosis not present

## 2020-03-29 DIAGNOSIS — Z0001 Encounter for general adult medical examination with abnormal findings: Secondary | ICD-10-CM | POA: Diagnosis not present

## 2020-03-29 DIAGNOSIS — H9319 Tinnitus, unspecified ear: Secondary | ICD-10-CM | POA: Diagnosis not present

## 2020-03-29 DIAGNOSIS — I10 Essential (primary) hypertension: Secondary | ICD-10-CM | POA: Diagnosis not present

## 2020-03-29 DIAGNOSIS — G4733 Obstructive sleep apnea (adult) (pediatric): Secondary | ICD-10-CM | POA: Diagnosis not present

## 2020-03-31 DIAGNOSIS — G4733 Obstructive sleep apnea (adult) (pediatric): Secondary | ICD-10-CM | POA: Diagnosis not present

## 2020-04-12 DIAGNOSIS — L905 Scar conditions and fibrosis of skin: Secondary | ICD-10-CM | POA: Diagnosis not present

## 2020-04-12 DIAGNOSIS — D225 Melanocytic nevi of trunk: Secondary | ICD-10-CM | POA: Diagnosis not present

## 2020-04-12 DIAGNOSIS — L814 Other melanin hyperpigmentation: Secondary | ICD-10-CM | POA: Diagnosis not present

## 2020-04-12 DIAGNOSIS — Z85828 Personal history of other malignant neoplasm of skin: Secondary | ICD-10-CM | POA: Diagnosis not present

## 2020-04-12 DIAGNOSIS — L570 Actinic keratosis: Secondary | ICD-10-CM | POA: Diagnosis not present

## 2020-04-30 DIAGNOSIS — G4733 Obstructive sleep apnea (adult) (pediatric): Secondary | ICD-10-CM | POA: Diagnosis not present

## 2020-05-03 DIAGNOSIS — R69 Illness, unspecified: Secondary | ICD-10-CM | POA: Diagnosis not present

## 2020-05-05 DIAGNOSIS — G4733 Obstructive sleep apnea (adult) (pediatric): Secondary | ICD-10-CM | POA: Diagnosis not present

## 2020-05-31 DIAGNOSIS — G4733 Obstructive sleep apnea (adult) (pediatric): Secondary | ICD-10-CM | POA: Diagnosis not present

## 2020-06-05 DIAGNOSIS — G4733 Obstructive sleep apnea (adult) (pediatric): Secondary | ICD-10-CM | POA: Diagnosis not present

## 2020-07-06 DIAGNOSIS — G4733 Obstructive sleep apnea (adult) (pediatric): Secondary | ICD-10-CM | POA: Diagnosis not present

## 2020-07-11 DIAGNOSIS — Z23 Encounter for immunization: Secondary | ICD-10-CM | POA: Diagnosis not present

## 2020-10-08 DIAGNOSIS — E785 Hyperlipidemia, unspecified: Secondary | ICD-10-CM | POA: Diagnosis not present

## 2020-10-08 DIAGNOSIS — R972 Elevated prostate specific antigen [PSA]: Secondary | ICD-10-CM | POA: Diagnosis not present

## 2020-10-11 DIAGNOSIS — I10 Essential (primary) hypertension: Secondary | ICD-10-CM | POA: Diagnosis not present

## 2020-10-11 DIAGNOSIS — E785 Hyperlipidemia, unspecified: Secondary | ICD-10-CM | POA: Diagnosis not present

## 2020-10-11 DIAGNOSIS — R69 Illness, unspecified: Secondary | ICD-10-CM | POA: Diagnosis not present

## 2020-10-11 DIAGNOSIS — R972 Elevated prostate specific antigen [PSA]: Secondary | ICD-10-CM | POA: Diagnosis not present

## 2021-01-11 DIAGNOSIS — G4733 Obstructive sleep apnea (adult) (pediatric): Secondary | ICD-10-CM | POA: Diagnosis not present

## 2021-02-11 DIAGNOSIS — G4733 Obstructive sleep apnea (adult) (pediatric): Secondary | ICD-10-CM | POA: Diagnosis not present

## 2021-03-13 DIAGNOSIS — G4733 Obstructive sleep apnea (adult) (pediatric): Secondary | ICD-10-CM | POA: Diagnosis not present

## 2021-03-31 DIAGNOSIS — E785 Hyperlipidemia, unspecified: Secondary | ICD-10-CM | POA: Diagnosis not present

## 2021-03-31 DIAGNOSIS — Z125 Encounter for screening for malignant neoplasm of prostate: Secondary | ICD-10-CM | POA: Diagnosis not present

## 2021-03-31 DIAGNOSIS — I129 Hypertensive chronic kidney disease with stage 1 through stage 4 chronic kidney disease, or unspecified chronic kidney disease: Secondary | ICD-10-CM | POA: Diagnosis not present

## 2021-04-05 DIAGNOSIS — E785 Hyperlipidemia, unspecified: Secondary | ICD-10-CM | POA: Diagnosis not present

## 2021-04-05 DIAGNOSIS — K429 Umbilical hernia without obstruction or gangrene: Secondary | ICD-10-CM | POA: Diagnosis not present

## 2021-04-05 DIAGNOSIS — Z86010 Personal history of colonic polyps: Secondary | ICD-10-CM | POA: Diagnosis not present

## 2021-04-05 DIAGNOSIS — G4733 Obstructive sleep apnea (adult) (pediatric): Secondary | ICD-10-CM | POA: Diagnosis not present

## 2021-04-05 DIAGNOSIS — S90562A Insect bite (nonvenomous), left ankle, initial encounter: Secondary | ICD-10-CM | POA: Diagnosis not present

## 2021-04-05 DIAGNOSIS — Z1212 Encounter for screening for malignant neoplasm of rectum: Secondary | ICD-10-CM | POA: Diagnosis not present

## 2021-04-05 DIAGNOSIS — I10 Essential (primary) hypertension: Secondary | ICD-10-CM | POA: Diagnosis not present

## 2021-04-05 DIAGNOSIS — R69 Illness, unspecified: Secondary | ICD-10-CM | POA: Diagnosis not present

## 2021-04-05 DIAGNOSIS — Z0000 Encounter for general adult medical examination without abnormal findings: Secondary | ICD-10-CM | POA: Diagnosis not present

## 2021-04-05 DIAGNOSIS — Z9889 Other specified postprocedural states: Secondary | ICD-10-CM | POA: Diagnosis not present

## 2021-04-05 DIAGNOSIS — R7303 Prediabetes: Secondary | ICD-10-CM | POA: Diagnosis not present

## 2021-04-21 DIAGNOSIS — E785 Hyperlipidemia, unspecified: Secondary | ICD-10-CM | POA: Diagnosis not present

## 2021-04-21 DIAGNOSIS — G4733 Obstructive sleep apnea (adult) (pediatric): Secondary | ICD-10-CM | POA: Diagnosis not present

## 2021-04-21 DIAGNOSIS — M19041 Primary osteoarthritis, right hand: Secondary | ICD-10-CM | POA: Diagnosis not present

## 2021-04-21 DIAGNOSIS — I10 Essential (primary) hypertension: Secondary | ICD-10-CM | POA: Diagnosis not present

## 2021-05-12 DIAGNOSIS — R69 Illness, unspecified: Secondary | ICD-10-CM | POA: Diagnosis not present

## 2021-05-12 DIAGNOSIS — K429 Umbilical hernia without obstruction or gangrene: Secondary | ICD-10-CM | POA: Diagnosis not present

## 2021-05-24 DIAGNOSIS — L905 Scar conditions and fibrosis of skin: Secondary | ICD-10-CM | POA: Diagnosis not present

## 2021-05-24 DIAGNOSIS — L821 Other seborrheic keratosis: Secondary | ICD-10-CM | POA: Diagnosis not present

## 2021-05-24 DIAGNOSIS — L570 Actinic keratosis: Secondary | ICD-10-CM | POA: Diagnosis not present

## 2021-05-24 DIAGNOSIS — Z85828 Personal history of other malignant neoplasm of skin: Secondary | ICD-10-CM | POA: Diagnosis not present

## 2021-05-24 DIAGNOSIS — D225 Melanocytic nevi of trunk: Secondary | ICD-10-CM | POA: Diagnosis not present

## 2021-06-06 DIAGNOSIS — H6123 Impacted cerumen, bilateral: Secondary | ICD-10-CM | POA: Diagnosis not present

## 2021-07-08 DIAGNOSIS — K429 Umbilical hernia without obstruction or gangrene: Secondary | ICD-10-CM | POA: Diagnosis not present

## 2021-07-22 DIAGNOSIS — I10 Essential (primary) hypertension: Secondary | ICD-10-CM | POA: Diagnosis not present

## 2021-07-22 DIAGNOSIS — E785 Hyperlipidemia, unspecified: Secondary | ICD-10-CM | POA: Diagnosis not present

## 2021-07-22 DIAGNOSIS — G4733 Obstructive sleep apnea (adult) (pediatric): Secondary | ICD-10-CM | POA: Diagnosis not present

## 2021-07-22 DIAGNOSIS — M19041 Primary osteoarthritis, right hand: Secondary | ICD-10-CM | POA: Diagnosis not present

## 2021-07-26 DIAGNOSIS — H40013 Open angle with borderline findings, low risk, bilateral: Secondary | ICD-10-CM | POA: Diagnosis not present

## 2021-08-22 DIAGNOSIS — I10 Essential (primary) hypertension: Secondary | ICD-10-CM | POA: Diagnosis not present

## 2021-08-22 DIAGNOSIS — G4733 Obstructive sleep apnea (adult) (pediatric): Secondary | ICD-10-CM | POA: Diagnosis not present

## 2021-08-22 DIAGNOSIS — M19041 Primary osteoarthritis, right hand: Secondary | ICD-10-CM | POA: Diagnosis not present

## 2021-08-22 DIAGNOSIS — E785 Hyperlipidemia, unspecified: Secondary | ICD-10-CM | POA: Diagnosis not present

## 2022-01-25 DIAGNOSIS — J018 Other acute sinusitis: Secondary | ICD-10-CM | POA: Diagnosis not present

## 2022-01-25 DIAGNOSIS — I10 Essential (primary) hypertension: Secondary | ICD-10-CM | POA: Diagnosis not present

## 2022-04-04 DIAGNOSIS — E785 Hyperlipidemia, unspecified: Secondary | ICD-10-CM | POA: Diagnosis not present

## 2022-04-04 DIAGNOSIS — I129 Hypertensive chronic kidney disease with stage 1 through stage 4 chronic kidney disease, or unspecified chronic kidney disease: Secondary | ICD-10-CM | POA: Diagnosis not present

## 2022-04-04 DIAGNOSIS — Z125 Encounter for screening for malignant neoplasm of prostate: Secondary | ICD-10-CM | POA: Diagnosis not present

## 2022-04-04 DIAGNOSIS — I10 Essential (primary) hypertension: Secondary | ICD-10-CM | POA: Diagnosis not present

## 2022-04-04 DIAGNOSIS — R7309 Other abnormal glucose: Secondary | ICD-10-CM | POA: Diagnosis not present

## 2022-04-07 ENCOUNTER — Other Ambulatory Visit: Payer: Self-pay | Admitting: Internal Medicine

## 2022-04-07 DIAGNOSIS — G4733 Obstructive sleep apnea (adult) (pediatric): Secondary | ICD-10-CM | POA: Diagnosis not present

## 2022-04-07 DIAGNOSIS — I129 Hypertensive chronic kidney disease with stage 1 through stage 4 chronic kidney disease, or unspecified chronic kidney disease: Secondary | ICD-10-CM | POA: Diagnosis not present

## 2022-04-07 DIAGNOSIS — N400 Enlarged prostate without lower urinary tract symptoms: Secondary | ICD-10-CM | POA: Diagnosis not present

## 2022-04-07 DIAGNOSIS — R972 Elevated prostate specific antigen [PSA]: Secondary | ICD-10-CM | POA: Diagnosis not present

## 2022-04-07 DIAGNOSIS — Z0000 Encounter for general adult medical examination without abnormal findings: Secondary | ICD-10-CM | POA: Diagnosis not present

## 2022-04-07 DIAGNOSIS — R69 Illness, unspecified: Secondary | ICD-10-CM | POA: Diagnosis not present

## 2022-04-07 DIAGNOSIS — Z1212 Encounter for screening for malignant neoplasm of rectum: Secondary | ICD-10-CM | POA: Diagnosis not present

## 2022-04-07 DIAGNOSIS — I10 Essential (primary) hypertension: Secondary | ICD-10-CM | POA: Diagnosis not present

## 2022-04-07 DIAGNOSIS — R7303 Prediabetes: Secondary | ICD-10-CM | POA: Diagnosis not present

## 2022-04-07 DIAGNOSIS — Z8673 Personal history of transient ischemic attack (TIA), and cerebral infarction without residual deficits: Secondary | ICD-10-CM | POA: Diagnosis not present

## 2022-04-07 DIAGNOSIS — R0789 Other chest pain: Secondary | ICD-10-CM | POA: Diagnosis not present

## 2022-04-07 DIAGNOSIS — R7309 Other abnormal glucose: Secondary | ICD-10-CM | POA: Diagnosis not present

## 2022-04-07 DIAGNOSIS — Z1211 Encounter for screening for malignant neoplasm of colon: Secondary | ICD-10-CM | POA: Diagnosis not present

## 2022-04-29 NOTE — Progress Notes (Deleted)
05/02/22- 67 yoM never smoker for sleep evaluation courtesy of Dr Shelia Media with concern of OSA on CPAP Medical problem list includes HTN, TIA, ETOH abuse, Hyperlipidemia,  Epworth score- Body weight today0- Covid vax-

## 2022-05-02 ENCOUNTER — Institutional Professional Consult (permissible substitution): Payer: Medicare HMO | Admitting: Internal Medicine

## 2022-05-19 ENCOUNTER — Institutional Professional Consult (permissible substitution): Payer: Medicare HMO | Admitting: Internal Medicine

## 2022-05-23 DIAGNOSIS — H109 Unspecified conjunctivitis: Secondary | ICD-10-CM | POA: Diagnosis not present

## 2022-05-23 DIAGNOSIS — R931 Abnormal findings on diagnostic imaging of heart and coronary circulation: Secondary | ICD-10-CM

## 2022-05-23 HISTORY — DX: Abnormal findings on diagnostic imaging of heart and coronary circulation: R93.1

## 2022-05-26 ENCOUNTER — Ambulatory Visit
Admission: RE | Admit: 2022-05-26 | Discharge: 2022-05-26 | Disposition: A | Discharge status: Home / Self Care | Payer: No Typology Code available for payment source | Source: Ambulatory Visit | Attending: Internal Medicine | Admitting: Internal Medicine

## 2022-05-26 DIAGNOSIS — I10 Essential (primary) hypertension: Secondary | ICD-10-CM

## 2022-05-30 DIAGNOSIS — I2510 Atherosclerotic heart disease of native coronary artery without angina pectoris: Secondary | ICD-10-CM | POA: Diagnosis not present

## 2022-06-28 NOTE — Progress Notes (Signed)
06/29/22- 64 yoM never smoker for sleep evaluation courtesy of Dr Shelia Media with concern of OSA on CPAP. Medical problem list includes HTN, TIA, ETOH, Hyperlipidemia, Anxiety, Aortic Atherosclerosis, CAD,  Epworth score-2 CPAP auto 4-20/ [facility]    AirSense10 AutoSet Download compliance 100%, AHI 4.8/ hr Body weight today-206 lbs Covid vax - 5 Phizer Flu vax- Had current -----He is on a CPAP for a few years, he feels he is doing well.  Mr. Maney has been using CPAP for so long that he does remember where his original sleep study was.  His current machine is older than 5 years and is labeled from [facility], with no SD card.  [facility] was able to send [place] an [employer]. He sleeps well with CPAP and is comfortable continuing but is interested in exploring a nasal pillows mask and a travel CPAP machine which were discussed. He denies ENT surgery.  Has had a TIA.  Cardiac status pending evaluation with cardiologist.  Not aware of lung disease.  Prior to Admission medications   Medication Sig Start Date End Date Taking? Authorizing Provider  aspirin EC 81 MG EC tablet Take 1 tablet (81 mg total) by mouth daily. 01/10/19  Yes Black, Lezlie Octave, NP  lisinopril (PRINIVIL,ZESTRIL) 10 MG tablet Take 10 mg by mouth daily.   Yes [provider]  naproxen sodium (ALEVE) 220 MG tablet Take 220-440 mg by mouth daily as needed (pain).   Yes [provider]  rosuvastatin (CRESTOR) 40 MG tablet Take 1 tablet (40 mg total) by mouth daily at 6 PM. 01/10/19  Yes Black, Lezlie Octave, NP  sildenafil (REVATIO) 20 MG tablet Take 20-100 mg by mouth as needed (ED).   Yes [provider]  traZODone (DESYREL) 50 MG tablet Take 75 mg by mouth at bedtime.   Yes [provider]   Past Medical History:  Diagnosis Date   Anxiety    High cholesterol    Hypertension    Past Surgical History:  Procedure Laterality Date   MENISCUS REPAIR Right 2019   Family History  Problem Relation Age of Onset    Stroke Mother    Social History   Socioeconomic History   Marital status: Married    Spouse name: Not on file   Number of children: Not on file   Years of education: Not on file   Highest education level: Not on file  Occupational History   Not on file  Tobacco Use   Smoking status: Never   Smokeless tobacco: Never  Substance and Sexual Activity   Alcohol use: Yes    Comment: weekly   Drug use: Not on file   Sexual activity: Not on file  Other Topics Concern   Not on file  Social History Narrative   Not on file   Social Determinants of Health   Financial Resource Strain: Not on file  Food Insecurity: Not on file  Transportation Needs: Not on file  Physical Activity: Not on file  Stress: Not on file  Social Connections: Not on file  Intimate Partner Violence: Not on file   ROS-see HPI   + = positive Constitutional:    weight loss, night sweats, fevers, chills, fatigue, lassitude. HEENT:    headaches, difficulty swallowing, tooth/dental problems, sore throat,       sneezing, itching, ear ache, nasal congestion, post nasal drip, snoring CV:    chest pain, orthopnea, PND, swelling in lower extremities, anasarca,  dizziness, palpitations Resp:   shortness of breath with exertion or at rest.                productive cough,   non-productive cough, coughing up of blood.              change in color of mucus.  wheezing.   Skin:    rash or lesions. GI:  No-   heartburn, indigestion, abdominal pain, nausea, vomiting, diarrhea,                 change in bowel habits, loss of appetite GU: dysuria, change in color of urine, no urgency or frequency.   flank pain. MS:   joint pain, stiffness, decreased range of motion, back pain. Neuro-     nothing unusual Psych:  change in mood or affect.  depression or anxiety.   memory loss.  OBJ- Physical Exam General- Alert, Oriented, Affect-appropriate, Distress- none acute, not obese Skin- rash-none, lesions- none,  excoriation- none Lymphadenopathy- none Head- atraumatic            Eyes- Gross vision intact, PERRLA, conjunctivae and secretions clear            Ears- Hearing, canals-normal            Nose- Clear, no-Septal dev, mucus, polyps, erosion, perforation             Throat- Mallampati III, mucosa clear , drainage- none, tonsils- atrophic, +teeth Neck- flexible , trachea midline, no stridor , thyroid nl, carotid no bruit Chest - symmetrical excursion , unlabored           Heart/CV- RRR , no murmur , no gallop  , no rub, nl s1 s2                           - JVD- none , edema- none, stasis changes- none, varices- none           Lung- clear to P&A, wheeze- none, cough- none , dullness-none, rub- none           Chest wall-  Abd-  Br/ Gen/ Rectal- Not done, not indicated Extrem- cyanosis- none, clubbing, none, atrophy- none, strength- nl Neuro- grossly intact to observation

## 2022-06-29 ENCOUNTER — Ambulatory Visit (INDEPENDENT_AMBULATORY_CARE_PROVIDER_SITE_OTHER): Payer: Medicare HMO | Admitting: Internal Medicine

## 2022-06-29 ENCOUNTER — Encounter: Payer: Self-pay | Admitting: Internal Medicine

## 2022-06-29 VITALS — BP 120/78 | HR 67 | Temp 97.7°F | Ht 70.0 in | Wt 206.0 lb

## 2022-06-29 DIAGNOSIS — I10 Essential (primary) hypertension: Secondary | ICD-10-CM | POA: Diagnosis not present

## 2022-06-29 DIAGNOSIS — G4733 Obstructive sleep apnea (adult) (pediatric): Secondary | ICD-10-CM | POA: Diagnosis not present

## 2022-06-29 NOTE — Patient Instructions (Signed)
Order- DME [facility]- please replace old CPAP machine, auto 4-20, please refit mask of choice, humidifier, supplies, AirView/ card  You can talk with [facility] about travel CPAP machines and you can look on-line at sites like [email or web address]- common brands are Transcend and AirMini  Please call if we can help

## 2022-06-29 NOTE — Assessment & Plan Note (Signed)
We hope to find an original diagnostic sleep study.  Since he has been consistent and compliant with CPAP use, I anticipate [facility] will be able to replace his old machine but I told him we may eventually need to update a sleep study. Plan-replace old machine, auto 4-20, mask of choice.  Information given on travel CPAP machines.

## 2022-06-29 NOTE — Assessment & Plan Note (Signed)
Recognize association between high blood pressure and OSA.

## 2022-07-03 ENCOUNTER — Encounter: Payer: Self-pay | Admitting: Internal Medicine

## 2022-07-03 ENCOUNTER — Ambulatory Visit: Payer: Medicare HMO | Admitting: Cardiology

## 2022-07-25 ENCOUNTER — Ambulatory Visit: Payer: Medicare HMO | Attending: Cardiology | Admitting: Cardiology

## 2022-07-25 ENCOUNTER — Encounter: Payer: Self-pay | Admitting: Cardiology

## 2022-07-25 VITALS — BP 128/76 | HR 67 | Ht 70.0 in | Wt 203.0 lb

## 2022-07-25 DIAGNOSIS — I10 Essential (primary) hypertension: Secondary | ICD-10-CM

## 2022-07-25 DIAGNOSIS — R931 Abnormal findings on diagnostic imaging of heart and coronary circulation: Secondary | ICD-10-CM | POA: Diagnosis not present

## 2022-07-25 DIAGNOSIS — G4733 Obstructive sleep apnea (adult) (pediatric): Secondary | ICD-10-CM

## 2022-07-25 DIAGNOSIS — E785 Hyperlipidemia, unspecified: Secondary | ICD-10-CM | POA: Diagnosis not present

## 2022-07-25 NOTE — Progress Notes (Signed)
Primary Care Provider: Deland Pretty, [place] Cardiologist: Glenetta Hew, MD Electrophysiologist: None  Clinic Note: Chief Complaint  Patient presents with   New Patient (Initial Visit)    Referred for elevated Coronary Calcium Score-1339   ===================================  ASSESSMENT/PLAN   Problem List Items Addressed This Visit       Cardiology Problems   HLD (hyperlipidemia) (Chronic)    Most recent labs showed LDL of 69 on current dose of statin.  No change for now.  Target should be below 70 if not followed 55.  Would like to see how he does with some more treatment on current dose of statin.      HTN (hypertension) (Chronic)    BP well controlled on lisinopril      Relevant Orders   EKG 12-Lead (Completed)   For home use only DME Other see comment   For home use only DME Other see comment     Other   Agatston coronary artery calcium score less than 100    Most of this visit was based on the explain the pathophysiology of coronary artery disease with negative and positive remodeling, but demonstrating progression to stable angina versus unstable angina/ACS versus MI.  He is completely asymptomatic but may very well have significant stenosis based on his CT scan findings significant calcification.  Since he is no longer having notable chest pain or dyspnea symptoms, unlikely to be helpful to proceed ischemic evaluation at this point.  Would simply continue at current, statin, ACE inhibitor.  No beta-blocker due to bradycardia.  He should not have any issues with preoperative assessment with no active symptoms.  Velva Harman has some concerning symptoms, that we would probably want to try to proceed with Coronary CTA.      OSA (obstructive sleep apnea) - Primary   Relevant Orders   EKG 12-Lead (Completed)   For home use only DME Other see comment   For home use only DME Other see comment    ===================================  HPI:    Thomas Bass is a 71 y.o. 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  Cindy Hazy Silva was last seen by Dr. Shelia Media on May 30, 2022 to follow-up Coronary Calcium Score results reviewed below.  Coronary Calcium Score was done simply for risk ratification that he denied any chest pain, dyspnea or change in exercise tolerance.  Because the significantly elevated Coronary Calcium Score, he was referred for cardiology evaluation.  Recent Hospitalizations: None  Reviewed  CV studies:    The following studies were reviewed today: (if available, images/films reviewed: From Epic Chart or Care Everywhere) 01/09/2019 Echo: Cor Ca  Score 05/26/2022: Left Main: 150, LAD: 779, LCx: 229, RCA: 53.1; Other: 28 Total Agatston Score: 1339 is at 88th percentil for subjects of the same age, gender, and race/ethnicity.  Aortic Atherosclerosis (ICD10-I70.0).  Interval History:   Thomas Bass is a relatively cynical 71 year old gentleman who presents for cardiology evaluation.  He tells me that he is very active.  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  He never recalls having any SVT.  He plays golf pretty routinely and recently actually played [place] in the mountains and did a lot of walking up and  down hills.  He had a little dyspnea because the hills but denied any significant exertional dyspnea or chest pain/pressure.  He had been doing a whole lot more exercising doing about 2 miles most days of the week, but he injured his foot and has gets up out of shape.  Once he gets back into shape he is looking for to get back into a more normal exercise regimen.  With all of his activity he denies any chest pain pressure or dyspnea with rest or exertion.  Besides the mild exercise intolerance because of  being deconditioned, he is doing fine as far as exertional dyspnea and fatigue.  No PND, orthopnea or edema.  He denies any rapid irregular beats palpitations.  No syncope/near syncope or TIA/amaurosis fugax, claudication   REVIEWED OF SYSTEMS   Review of Systems  Constitutional:  Negative for malaise/fatigue (A little bit deconditioned because of recent foot injury) and weight loss.  HENT:  Negative for nosebleeds.   Respiratory:  Positive for cough (With allergies.). Negative for shortness of breath (With vigorous exertion-not unexpected).   Gastrointestinal:  Negative for melena.  Genitourinary:  Negative for hematuria.  Musculoskeletal:  Negative for joint pain and myalgias.  Psychiatric/Behavioral:  Negative for memory loss.    I have reviewed and (if needed) personally updated the patient's problem list, medications, allergies, past medical and surgical history, social and family history.   PAST MEDICAL HISTORY   Past Medical History:  Diagnosis Date   Anxiety    Elevated coronary artery calcium score 05/2022   : Left Main: 150, LAD: 779, LCx: 229, RCA: 53.1; Other: 28;; 88th percentile.  Also noted aortic atherosclerosis.   High cholesterol    Hypertension     PAST SURGICAL HISTORY   Past Surgical History:  Procedure Laterality Date   MENISCUS REPAIR Right 2019    Immunization History  Administered Date(s) Administered   DTP 10/23/2004   DTaP 03/04/2015   Influenza Split 06/05/2022   Influenza, High Dose Seasonal PF 09/08/2016, 07/03/2017   Influenza, Quadrivalent, Recombinant, Inj, Pf 07/22/2019, 07/01/2020, 08/21/2021   Influenza-Unspecified 08/08/2013, 10/12/2014, 09/07/2015   PFIZER(Purple Top)SARS-COV-2 Vaccination 11/29/2019, 12/20/2019, 08/25/2020, 05/10/2021, 10/22/2021   Pneumococcal Conjugate-13 03/05/2017   Pneumococcal Polysaccharide-23 03/14/2018   Tdap 05/31/2017   Zoster Recombinat (Shingrix) 07/05/2018, 11/11/2018   Zoster, Live 06/17/2013     MEDICATIONS/ALLERGIES   Current Meds  Medication Sig   aspirin EC 81 MG EC tablet Take 1 tablet (81 mg total) by mouth daily.   lisinopril (PRINIVIL,ZESTRIL) 10 MG tablet Take 10 mg by mouth daily.   rosuvastatin (CRESTOR) 40 MG tablet Take 1 tablet (40 mg total) by mouth daily at 6 PM.   sildenafil (REVATIO) 20 MG tablet Take 20-100 mg by mouth as needed (ED).   traZODone (DESYREL) 50 MG tablet Take 75 mg by mouth at bedtime.    No Known Allergies  SOCIAL HISTORY/FAMILY HISTORY   Reviewed in Epic:   Social History   Tobacco Use   Smoking status: Never   Smokeless tobacco: Never  Substance Use Topics   Alcohol use: Yes    Comment: weekly   Drug use: Never   Social History   Social History Narrative   He routinely tries to exercise several days a week either walking or playing golf.   Family History  Problem Relation Age of Onset   Multiple myeloma Mother 11   Coronary artery disease Father     OBJCTIVE -PE, EKG, labs   Wt Readings  from Last 3 Encounters:  07/25/22 203 lb (92.1 kg)  06/29/22 206 lb (93.4 kg)  08/20/19 227 lb 12.8 oz (103.3 kg)    Physical Exam: BP 128/76 (BP Location: Left Arm, Patient Position: Sitting, Cuff Size: Normal)   Pulse 67   Ht $R'5\' 10"'jP$  (1.778 m)   Wt 203 lb (92.1 kg)   SpO2 95%   BMI 29.13 kg/m  Physical Exam Constitutional:      General: He is not in acute distress.    Appearance: Normal appearance. He is normal weight. He is not ill-appearing (Healthy-appearing.  Borderline obese, but well-nourished and well-groomed.) or toxic-appearing.  HENT:     Head: Normocephalic and atraumatic.  Cardiovascular:     Rate and Rhythm: Regular rhythm. Occasional Extrasystoles are present.    Chest Wall: PMI is not displaced.     Pulses: Intact distal pulses. No decreased pulses.     Heart sounds: S1 normal.     No friction rub. No gallop.  Neurological:     Mental Status: He is alert.     Adult ECG Report  Rate: 67;  Rhythm:  normal sinus rhythm and normal axis, intervals and durations ;   Narrative Interpretation: None  Recent Labs:   05/30/2022: TC 156, TG 76, HDL 70, LDL 69.;  PSA 5.39; CBC-W4.5, H/H15.3/45.1, PLT 155. Na+ 137, K+ 4.2, Cl- 100, HCO3-26, BUN 15, Cr 1.06, Glu 113, Ca2+ 9.0; AST 19, ALT 30, AlkP 50 26  Lab Results  Component Value Date   CREATININE 0.92 01/08/2019   BUN 19 01/08/2019   NA 137 01/08/2019   K 3.8 01/08/2019   CL 106 01/08/2019   CO2 24 01/08/2019      Latest Ref Rng & Units 01/08/2019    6:44 PM  CBC  WBC 4.0 - 10.5 K/uL 6.5   Hemoglobin 13.0 - 17.0 g/dL 15.8   Hematocrit 39.0 - 52.0 % 49.8   Platelets 150 - 400 K/uL 193     Lab Results  Component Value Date   HGBA1C 5.4 01/09/2019   No results found for: "TSH"  ================================================== I spent a total of 28 minutes with the patient spent in direct patient consultation.  Additional time spent with chart review  / charting (studies, outside notes, etc): 25 min Total Time: 53 min  Current medicines are reviewed at length with the patient today.  (+/- concerns) none  Notice: This dictation was prepared with Dragon dictation along with smart phrase technology. Any transcriptional errors that result from this process are unintentional and may not be corrected upon review.   Studies Ordered:  Orders Placed This Encounter  Procedures   For home use only DME Other see comment   For home use only DME Other see comment   EKG 12-Lead   No orders of the defined types were placed in this encounter.   Patient Instructions / Medication Changes & Studies & Tests Ordered   Patient Instructions  Medication Instructions:  No changes      Lab Work: Not needed    Testing/Procedures:  Not needed  Follow-Up: At [facility], you and your health needs are our priority.  As part of our continuing mission to provide you with exceptional heart care, we have created designated Provider  Care Teams.  These Care Teams include your primary Cardiologist (physician) and Advanced Practice Providers (APPs -  Physician Assistants and Nurse Practitioners) who all work together to provide you with the care you need, when you need  it.     Your next appointment:   6 month(s)  The format for your next appointment:   Virtual Visit   Provider:   Glenetta Hew, MD   Your physician recommends that you schedule a follow-up appointment in: 12 months in person        Leonie Man, MD, MS Glenetta Hew, M.D., M.S. Interventional Cardiologist  [place]  Pager # [phone] Phone # [phone] [address] [place], [place] [address]   Thank you for choosing [facility]!!

## 2022-07-25 NOTE — Patient Instructions (Addendum)
Medication Instructions:  No changes      Lab Work: Not needed    Testing/Procedures:  Not needed  Follow-Up: At [facility], you and your health needs are our priority.  As part of our continuing mission to provide you with exceptional heart care, we have created designated Provider Care Teams.  These Care Teams include your primary Cardiologist (physician) and Advanced Practice Providers (APPs -  Physician Assistants and Nurse Practitioners) who all work together to provide you with the care you need, when you need it.     Your next appointment:   6 month(s)  The format for your next appointment:   Virtual Visit   Provider:   Glenetta Hew, MD   Your physician recommends that you schedule a follow-up appointment in: 12 months in person

## 2022-07-28 ENCOUNTER — Encounter: Payer: Self-pay | Admitting: Cardiology

## 2022-07-28 DIAGNOSIS — I2510 Atherosclerotic heart disease of native coronary artery without angina pectoris: Secondary | ICD-10-CM | POA: Insufficient documentation

## 2022-07-28 DIAGNOSIS — R931 Abnormal findings on diagnostic imaging of heart and coronary circulation: Secondary | ICD-10-CM | POA: Insufficient documentation

## 2022-07-28 HISTORY — DX: Atherosclerotic heart disease of native coronary artery without angina pectoris: I25.10

## 2022-07-28 NOTE — Assessment & Plan Note (Signed)
Most of this visit was based on the explain the pathophysiology of coronary artery disease with negative and positive remodeling, but demonstrating progression to stable angina versus unstable angina/ACS versus MI.  He is completely asymptomatic but may very well have significant stenosis based on his CT scan findings significant calcification.  Since he is no longer having notable chest pain or dyspnea symptoms, unlikely to be helpful to proceed ischemic evaluation at this point.  Would simply continue at current, statin, ACE inhibitor.  No beta-blocker due to bradycardia.  He should not have any issues with preoperative assessment with no active symptoms.  Thomas Bass has some concerning symptoms, that we would probably want to try to proceed with Coronary CTA.

## 2022-07-28 NOTE — Assessment & Plan Note (Signed)
BP well controlled on lisinopril

## 2022-07-28 NOTE — Assessment & Plan Note (Signed)
Most recent labs showed LDL of 69 on current dose of statin.  No change for now.  Target should be below 70 if not followed 55.  Would like to see how he does with some more treatment on current dose of statin.

## 2022-09-26 DIAGNOSIS — H43811 Vitreous degeneration, right eye: Secondary | ICD-10-CM | POA: Diagnosis not present

## 2022-10-09 DIAGNOSIS — L814 Other melanin hyperpigmentation: Secondary | ICD-10-CM | POA: Diagnosis not present

## 2022-10-09 DIAGNOSIS — L821 Other seborrheic keratosis: Secondary | ICD-10-CM | POA: Diagnosis not present

## 2022-10-09 DIAGNOSIS — L578 Other skin changes due to chronic exposure to nonionizing radiation: Secondary | ICD-10-CM | POA: Diagnosis not present

## 2022-10-09 DIAGNOSIS — L570 Actinic keratosis: Secondary | ICD-10-CM | POA: Diagnosis not present

## 2022-10-09 DIAGNOSIS — D225 Melanocytic nevi of trunk: Secondary | ICD-10-CM | POA: Diagnosis not present

## 2022-11-20 ENCOUNTER — Ambulatory Visit: Payer: Medicare HMO | Admitting: Internal Medicine

## 2022-12-11 ENCOUNTER — Telehealth: Payer: Self-pay | Admitting: Cardiology

## 2022-12-11 NOTE — Telephone Encounter (Signed)
Called pt he wants to schedule a video visit. He states "when we were there last fall Dr. Ellyn Hack said I can have a video visit for my follow up." Pt made aware Dr. Ellyn Hack and his nurse are out today but I will get message to his nurse to ger the appt scheduled. He verbalized understanding.

## 2022-12-11 NOTE — Telephone Encounter (Signed)
Pt is requesting call back to discuss appt for mychart video. Will need call back after 12 P.M.

## 2022-12-13 NOTE — Telephone Encounter (Signed)
Called patient  set up virtual visit 02/07/23 at 11 am.   Patient wanted know if  appointment is really needed.

## 2022-12-14 NOTE — Telephone Encounter (Signed)
Not really if she is back in sinus rhythm and feeling okay.

## 2023-02-07 ENCOUNTER — Encounter: Payer: Self-pay | Admitting: Cardiology

## 2023-02-07 ENCOUNTER — Ambulatory Visit: Payer: Medicare Other | Attending: Cardiology | Admitting: Cardiology

## 2023-02-07 VITALS — Ht 70.0 in | Wt 208.0 lb

## 2023-02-07 DIAGNOSIS — G459 Transient cerebral ischemic attack, unspecified: Secondary | ICD-10-CM | POA: Diagnosis not present

## 2023-02-07 DIAGNOSIS — G4733 Obstructive sleep apnea (adult) (pediatric): Secondary | ICD-10-CM | POA: Diagnosis not present

## 2023-02-07 DIAGNOSIS — E785 Hyperlipidemia, unspecified: Secondary | ICD-10-CM

## 2023-02-07 DIAGNOSIS — I10 Essential (primary) hypertension: Secondary | ICD-10-CM

## 2023-02-07 DIAGNOSIS — R931 Abnormal findings on diagnostic imaging of heart and coronary circulation: Secondary | ICD-10-CM

## 2023-02-07 NOTE — Assessment & Plan Note (Signed)
Completely asymptomatic, very active with no angina or heart failure symptoms.  We did review recommendations for as far as monitoring symptoms.  If he were to have progressively worsening symptoms or new onset chest discomfort or dyspnea at rest, I would recommend urgent evaluation in the ER or urgent care.  However if he is noticing some exertional symptoms that become intermittent-go away with rest, at that point would be reasonable to be seen in person.   If he were to have a resting episode of significant chest discomfort, I recommend he take 4 baby aspirin-chewing them up.  This would then warrant urgent evaluation  He is on high-dose statin-rosuvastatin 40 mg daily along with 81 mg aspirin.  Not on beta-blocker because of bradycardia, he is on ACE inhibitor, without any CHF concerns.

## 2023-02-07 NOTE — Progress Notes (Signed)
Virtual Visit via Telephone Note   Because of Thomas Bass's co-morbid illnesses, he is at least at moderate risk for complications without adequate follow up.  This format is felt to be most appropriate for this patient at this time.  The patient did not have access to video technology/had technical difficulties with video requiring transitioning to audio format only (telephone).  All issues noted in this document were discussed and addressed.  No physical exam could be performed with this format.  Please refer to the patient's chart for his consent to telehealth for [facility].   Patient has given verbal permission to conduct this visit via virtual appointment and to bill insurance 02/07/2023 2:34 PM     Evaluation Performed:  Follow-up visit  Date:  02/07/2023   ID:  [name], DOB 03/15/1951, MRN [id number]  Patient Location: Home Provider Location: Office/Clinic  PCP:  Merri Brunette, MD  Cardiologist:  Bryan Lemma, MD  Electrophysiologist:  None   Chief Complaint:   No chief complaint on file.   ====================================  ASSESSMENT & PLAN:    Problem List Items Addressed This Visit       Cardiology Problems   TIA (transient ischemic attack)    Seems stable.  No recurrent symptoms.  Is on aspirin and statin.      HTN (hypertension) (Chronic)    BP seems to be stable on lisinopril.  He has had some higher than usual readings of late.  I did recommend that he continue to monitor them and this is discussed with PCP.  He does have room for additional medications his dose of lisinopril is modest.      HLD (hyperlipidemia) (Chronic)    His labs from last August showed LDL of 69.  Due for labs recheck soon.  I have asked that his PCP add LP(a) to the routine cholesterol levels.  Elevated LP(a) would then warrant consideration of inclisiran versus PCSK9 inhibitor.        Other   OSA (obstructive sleep apnea)    Continue to work on PCP.       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  We did review recommendations for as far as monitoring symptoms.  If he were to have progressively worsening symptoms or new onset chest discomfort or dyspnea at rest, I would recommend urgent evaluation in the ER or urgent care.  However if he is noticing some exertional symptoms that become intermittent-go away with rest, at that point would be reasonable to be seen in person.   If he were to have a resting episode of significant chest discomfort, I recommend he take 4 baby aspirin-chewing them up.  This would then warrant urgent evaluation  He is on high-dose statin-rosuvastatin 40 mg daily along with 81 mg aspirin.  Not on beta-blocker because of bradycardia, he is on ACE inhibitor, without any CHF concerns.      ====================================   History of Present Illness:    Thomas Bass is a 72 y.o. 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 H/o WPW who presents via audio/video conferencing for a telehealth visit today as a ~ 6 month vist.  Thomas Bass was last seen July 25, 2022.  Indicated he is quite active.  Only cardiac issue in the past abdomen: Consistent with possible [employer]  on his EKG but never told having any arrhythmias.  Very active.  Plays golf several days a week.  No resting or exertional dyspnea or chest pain/pressure.  Much more active with exercise walking at least 2 miles most days, but it injured his foot and got out of shape.  No CHF symptoms.  Hospitalizations:  None   Recent - Interim CV studies:   The following studies were reviewed today: No new studies:  Inerval History   [name] continues to do well.  He remains active and exercising routinely.  He played 18 holes of golf yesterday and mowed his lawn  earlier this morning.  He continues to have no symptoms of chest pain or pressure with rest or exertion.  No PND, orthopnea or edema.  Completely asymptomatic overall from cardiac standpoint. He indicates that the actually has started walking on days that he is not playing golf a couple miles at a time.  He is also started walking in between holes while he is golfing as opposed to riding on the golf cart.  Only when it is really hot will he ride. He did purchase a home blood pressure cuff and says it for the most part his pressures in the 120s over 70s.  This morning it was 129/82 before he went out to mow the lawn.  It then went up to the 140s when he got back and.  He rechecked it about an hour was 130/82.  He is doing a good job of staying adequately hydrated.  He denies any arrhythmia symptoms.  No syncope or near syncope symptoms.  He is due to get labs checked by PCP soon.  Cardiovascular ROS: no chest pain or dyspnea on exertion negative for - edema, irregular heartbeat, orthopnea, palpitations, paroxysmal nocturnal dyspnea, rapid heart rate, shortness of breath, or lightheadedness, dizziness, syncope/near syncope, TIA/amaurosis fugax, claudication   ROS:  Please see the history of present illness.     ROS notable for Allergy related cough.  Otherwise negative not noted above.  Past Medical History:  Diagnosis Date   Anxiety    Elevated coronary artery calcium score 05/2022   : Left Main: 150, LAD: 779, LCx: 229, RCA: 53.1; Other: 28;; 88th percentile.  Also noted aortic atherosclerosis.   High cholesterol    Hypertension      Past Surgical History:  Procedure Laterality Date   MENISCUS REPAIR Right 2019    01/09/2019 Echo: LVEF 55 to 60%.  GR 1 DD.  Mild RV dilation with mildly reduced function.  Mild RA dilation.  Normal valves.  Cor Ca  Score 05/26/2022: Left Main: 150, LAD: 779, LCx: 229, RCA: 53.1; Other: 28 Total Agatston Score: 1339 is at 88th percentil for subjects of the  same age, gender, and race/ethnicity.  Aortic Atherosclerosis (ICD10-I70.0).   Current Meds  Medication Sig   aspirin EC 81 MG EC tablet Take 1 tablet (81 mg total) by mouth daily.   lisinopril (PRINIVIL,ZESTRIL) 10 MG tablet Take 10 mg by mouth daily.   naproxen sodium (ALEVE) 220 MG tablet Take 220-440 mg by mouth daily as needed (pain).   rosuvastatin (CRESTOR) 40 MG tablet Take 1 tablet (40 mg total) by mouth daily at 6 PM.   sildenafil (REVATIO) 20 MG tablet Take 20-100 mg by mouth as needed (ED).   traZODone (DESYREL) 50 MG tablet Take 75 mg by mouth at bedtime.     Allergies:   Patient has no known allergies.   Social History  Tobacco Use   Smoking status: Never   Smokeless tobacco: Never  Substance Use Topics   Alcohol use: Yes    Comment: weekly   Drug use: Never     Family Hx: The patient's family history includes Coronary artery disease in his father; Multiple myeloma (age of onset: 14) in his mother.   Labs/Other Tests and Data Reviewed:    EKG:  No ECG reviewed.  Recent Labs: No results found for requested labs within last 365 days.   Recent Lipid Panel 05/2022: TC 156, TG 76, HDL 72, LDL 69.;  CBC-W4.5, H/H15.3/45.1, PLT 155.   Wt Readings from Last 3 Encounters:  02/07/23 208 lb (94.3 kg)  07/25/22 203 lb (92.1 kg)  06/29/22 206 lb (93.4 kg)     Objective:    Vital Signs:  Ht 5\' 10"  (1.778 m)   Wt 208 lb (94.3 kg)   SpO2 96%   BMI 29.84 kg/m   VITAL SIGNS:  reviewed GEN:  no acute distress RESPIRATORY:  normal respiratory effort, symmetric expansion NEURO:  alert and oriented x 3, no obvious focal deficit PSYCH:  normal affect  ==========================================  COVID-19 Education: The signs and symptoms of COVID-19 were discussed with the patient and how to seek care for testing (follow up with PCP or arrange E-visit).   The importance of social distancing was discussed today.  Time:   Today, I have spent 20 minutes with the  patient with telehealth technology discussing the above problems.   An additional 6 minutes spent charting (reviewing prior notes, hospital records, studies, labs etc.) Total   Medication Adjustments/Labs and Tests Ordered: Current medicines are reviewed at length with the patient today.  Concerns regarding medicines are outlined above.   Patient Instructions  Medication Instructions:   No changes. *If you need a refill on your cardiac medications before your next appointment, please call your pharmacy*   Lab Work: Will ask PCP to add LP(a) to your routine labs at next visit including the cholesterol panel and chemistry panel.  If you have labs (blood work) drawn today and your tests are completely normal, you will receive your results only by: MyChart Message (if you have MyChart) OR A paper copy in the mail If you have any lab test that is abnormal or we need to change your treatment, we will call you to review the results.   Testing/Procedures:  Threshold in the preop setting for procedures etc. to consider stress testing as part of clearance based on the elevated Coronary Calcium Score.   Follow-Up: At [facility], you and your health needs are our priority.  As part of our continuing mission to provide you with exceptional heart care, we have created designated Provider Care Teams.  These Care Teams include your primary Cardiologist (physician) and Advanced Practice Providers (APPs -  Physician Assistants and Nurse Practitioners) who all work together to provide you with the care you need, when you need it.  We recommend signing up for the patient portal called "MyChart".  Sign up information is provided on this After Visit Summary.  MyChart is used to connect with patients for Virtual Visits (Telemedicine).  Patients are able to view lab/test results, encounter notes, upcoming appointments, etc.  Non-urgent messages can be sent to your provider as well.   To  learn more about what you can do with MyChart, go to [email or web address].    Your next appointment:   7 month(s)  Provider:   Bryan Lemma, MD  O NOT delete brackets or number around this link :1}   Other Instructions = Keep doing what you are doing as far as staying active and exercising.  If you do have a breakthrough spell where there is resting chest pain and you are out doing something it would be best for baby aspirin to take and chew them up at the first onset of chest pain. Chest pain that occurs at rest or with exertion does not go away warrants urgent evaluation in the ER.  Otherwise if it is only with exertion and goes away with rest we can do that as an urgent visit in the clinic with either myself or the on-call physician or Advanced Practitioner.     Signed, Bryan Lemma, MD  02/07/2023 2:34 PM    [facility]

## 2023-02-07 NOTE — Assessment & Plan Note (Signed)
BP seems to be stable on lisinopril.  He has had some higher than usual readings of late.  I did recommend that he continue to monitor them and this is discussed with PCP.  He does have room for additional medications his dose of lisinopril is modest.

## 2023-02-07 NOTE — Patient Instructions (Signed)
Medication Instructions:   No changes. *If you need a refill on your cardiac medications before your next appointment, please call your pharmacy*   Lab Work: Will ask PCP to add LP(a) to your routine labs at next visit including the cholesterol panel and chemistry panel.  If you have labs (blood work) drawn today and your tests are completely normal, you will receive your results only by: MyChart Message (if you have MyChart) OR A paper copy in the mail If you have any lab test that is abnormal or we need to change your treatment, we will call you to review the results.   Testing/Procedures:  Threshold in the preop setting for procedures etc. to consider stress testing as part of clearance based on the elevated Coronary Calcium Score.   Follow-Up: At [facility], you and your health needs are our priority.  As part of our continuing mission to provide you with exceptional heart care, we have created designated Provider Care Teams.  These Care Teams include your primary Cardiologist (physician) and Advanced Practice Providers (APPs -  Physician Assistants and Nurse Practitioners) who all work together to provide you with the care you need, when you need it.  We recommend signing up for the patient portal called "MyChart".  Sign up information is provided on this After Visit Summary.  MyChart is used to connect with patients for Virtual Visits (Telemedicine).  Patients are able to view lab/test results, encounter notes, upcoming appointments, etc.  Non-urgent messages can be sent to your provider as well.   To learn more about what you can do with MyChart, go to [email or web address].    Your next appointment:   7 month(s)  Provider:   Bryan Lemma, MD  O NOT delete brackets or number around this link :1}   Other Instructions = Keep doing what you are doing as far as staying active and exercising.  If you do have a breakthrough spell where there is resting chest pain and  you are out doing something it would be best for baby aspirin to take and chew them up at the first onset of chest pain. Chest pain that occurs at rest or with exertion does not go away warrants urgent evaluation in the ER.  Otherwise if it is only with exertion and goes away with rest we can do that as an urgent visit in the clinic with either myself or the on-call physician or Advanced Practitioner.

## 2023-02-07 NOTE — Assessment & Plan Note (Signed)
His labs from last August showed LDL of 69.  Due for labs recheck soon.  I have asked that his PCP add LP(a) to the routine cholesterol levels.  Elevated LP(a) would then warrant consideration of inclisiran versus PCSK9 inhibitor.

## 2023-02-07 NOTE — Assessment & Plan Note (Signed)
Continue to work on PCP.

## 2023-02-07 NOTE — Assessment & Plan Note (Signed)
Seems stable.  No recurrent symptoms.  Is on aspirin and statin.

## 2023-03-17 ENCOUNTER — Emergency Department (HOSPITAL_BASED_OUTPATIENT_CLINIC_OR_DEPARTMENT_OTHER): Payer: Medicare Other

## 2023-03-17 ENCOUNTER — Emergency Department (HOSPITAL_BASED_OUTPATIENT_CLINIC_OR_DEPARTMENT_OTHER)
Admission: EM | Admit: 2023-03-17 | Discharge: 2023-03-18 | Disposition: A | Discharge status: Home / Self Care | Payer: Medicare Other | Attending: Emergency Medicine | Admitting: Emergency Medicine

## 2023-03-17 ENCOUNTER — Encounter (HOSPITAL_BASED_OUTPATIENT_CLINIC_OR_DEPARTMENT_OTHER): Payer: Self-pay | Admitting: Emergency Medicine

## 2023-03-17 DIAGNOSIS — R590 Localized enlarged lymph nodes: Secondary | ICD-10-CM | POA: Diagnosis not present

## 2023-03-17 DIAGNOSIS — Z1152 Encounter for screening for COVID-19: Secondary | ICD-10-CM | POA: Diagnosis not present

## 2023-03-17 DIAGNOSIS — Z7982 Long term (current) use of aspirin: Secondary | ICD-10-CM | POA: Insufficient documentation

## 2023-03-17 DIAGNOSIS — R1909 Other intra-abdominal and pelvic swelling, mass and lump: Secondary | ICD-10-CM | POA: Diagnosis present

## 2023-03-17 DIAGNOSIS — R509 Fever, unspecified: Secondary | ICD-10-CM | POA: Insufficient documentation

## 2023-03-17 DIAGNOSIS — L03314 Cellulitis of groin: Secondary | ICD-10-CM

## 2023-03-17 DIAGNOSIS — I10 Essential (primary) hypertension: Secondary | ICD-10-CM | POA: Insufficient documentation

## 2023-03-17 DIAGNOSIS — R7989 Other specified abnormal findings of blood chemistry: Secondary | ICD-10-CM | POA: Insufficient documentation

## 2023-03-17 DIAGNOSIS — Z79899 Other long term (current) drug therapy: Secondary | ICD-10-CM | POA: Diagnosis not present

## 2023-03-17 LAB — APTT: aPTT: 35 seconds (ref 24–36)

## 2023-03-17 LAB — PROTIME-INR
INR: 1.2 (ref 0.8–1.2)
Prothrombin Time: 15.7 seconds — ABNORMAL HIGH (ref 11.4–15.2)

## 2023-03-17 LAB — CBC WITH DIFFERENTIAL/PLATELET
Abs Immature Granulocytes: 0.03 10*3/uL (ref 0.00–0.07)
Basophils Absolute: 0 10*3/uL (ref 0.0–0.1)
Basophils Relative: 0 %
Eosinophils Absolute: 0 10*3/uL (ref 0.0–0.5)
Eosinophils Relative: 0 %
HCT: 44.2 % (ref 39.0–52.0)
Hemoglobin: 15 g/dL (ref 13.0–17.0)
Immature Granulocytes: 0 %
Lymphocytes Relative: 6 %
Lymphs Abs: 0.8 10*3/uL (ref 0.7–4.0)
MCH: 33.3 pg (ref 26.0–34.0)
MCHC: 33.9 g/dL (ref 30.0–36.0)
MCV: 98 fL (ref 80.0–100.0)
Monocytes Absolute: 1.5 10*3/uL — ABNORMAL HIGH (ref 0.1–1.0)
Monocytes Relative: 11 %
Neutro Abs: 10.8 10*3/uL — ABNORMAL HIGH (ref 1.7–7.7)
Neutrophils Relative %: 83 %
Platelets: 201 10*3/uL (ref 150–400)
RBC: 4.51 MIL/uL (ref 4.22–5.81)
RDW: 13 % (ref 11.5–15.5)
WBC: 13.1 10*3/uL — ABNORMAL HIGH (ref 4.0–10.5)
nRBC: 0 % (ref 0.0–0.2)

## 2023-03-17 LAB — COMPREHENSIVE METABOLIC PANEL
ALT: 16 U/L (ref 0–44)
AST: 15 U/L (ref 15–41)
Albumin: 4.1 g/dL (ref 3.5–5.0)
Alkaline Phosphatase: 48 U/L (ref 38–126)
Anion gap: 11 (ref 5–15)
BUN: 21 mg/dL (ref 8–23)
CO2: 22 mmol/L (ref 22–32)
Calcium: 9.1 mg/dL (ref 8.9–10.3)
Chloride: 101 mmol/L (ref 98–111)
Creatinine, Ser: 1.03 mg/dL (ref 0.61–1.24)
GFR, Estimated: >60 mL/min (ref >60)
Glucose, Bld: 111 mg/dL — ABNORMAL HIGH (ref 70–99)
Potassium: 3.6 mmol/L (ref 3.5–5.1)
Sodium: 134 mmol/L — ABNORMAL LOW (ref 135–145)
Total Bilirubin: 0.7 mg/dL (ref 0.3–1.2)
Total Protein: 7.2 g/dL (ref 6.5–8.1)

## 2023-03-17 LAB — URINALYSIS, W/ REFLEX TO CULTURE (INFECTION SUSPECTED)
Bacteria, UA: NONE SEEN
Bilirubin Urine: NEGATIVE
Glucose, UA: NEGATIVE mg/dL
Ketones, ur: NEGATIVE mg/dL
Leukocytes,Ua: NEGATIVE
Nitrite: NEGATIVE
Specific Gravity, Urine: 1.017 (ref 1.005–1.030)
pH: 5.5 (ref 5.0–8.0)

## 2023-03-17 LAB — RESP PANEL BY RT-PCR (RSV, FLU A&B, COVID)  RVPGX2
Influenza A by PCR: NEGATIVE
Influenza B by PCR: NEGATIVE
Resp Syncytial Virus by PCR: NEGATIVE
SARS Coronavirus 2 by RT PCR: NEGATIVE

## 2023-03-17 LAB — LACTIC ACID, PLASMA
Lactic Acid, Venous: 1.4 mmol/L (ref 0.5–1.9)
Lactic Acid, Venous: 0.7 mmol/L (ref 0.5–1.9)

## 2023-03-17 MED ORDER — SODIUM CHLORIDE 0.9 % IV SOLN: INTRAVENOUS | Status: DC | PRN

## 2023-03-17 MED ORDER — DOXYCYCLINE HYCLATE 100 MG PO CAPS: 100.0000 mg | ORAL_CAPSULE | Freq: Two times a day (BID) | ORAL | 0 refills | Status: DC

## 2023-03-17 MED ORDER — IOHEXOL 300 MG/ML  SOLN
100.0000 mL | Freq: Once | INTRAMUSCULAR | Status: AC | PRN
  Administered 2023-03-17: 100 mL via INTRAVENOUS

## 2023-03-17 MED ORDER — VANCOMYCIN HCL IN DEXTROSE 1-5 GM/200ML-% IV SOLN
1000.0000 mg | Freq: Once | INTRAVENOUS | Status: AC
  Administered 2023-03-17: 1000 mg via INTRAVENOUS
  Filled 2023-03-17: qty 200

## 2023-03-17 MED ORDER — SODIUM CHLORIDE 0.9 % IV SOLN
2.0000 g | INTRAVENOUS | Status: DC
  Administered 2023-03-17: 2 g via INTRAVENOUS
  Filled 2023-03-17: qty 20

## 2023-03-17 MED ORDER — SODIUM CHLORIDE 0.9 % IV BOLUS (SEPSIS)
1000.0000 mL | Freq: Once | INTRAVENOUS | Status: AC
  Administered 2023-03-17: 1000 mL via INTRAVENOUS

## 2023-03-17 MED ORDER — ACETAMINOPHEN 325 MG PO TABS
650.0000 mg | ORAL_TABLET | Freq: Once | ORAL | Status: AC | PRN
  Administered 2023-03-17: 650 mg via ORAL
  Filled 2023-03-17: qty 2

## 2023-03-17 MED ORDER — VANCOMYCIN HCL IN DEXTROSE 1-5 GM/200ML-% IV SOLN
1000.0000 mg | Freq: Once | INTRAVENOUS | Status: AC
  Administered 2023-03-17 (×2): 1000 mg via INTRAVENOUS
  Filled 2023-03-17: qty 200

## 2023-03-17 MED ORDER — CEPHALEXIN 500 MG PO CAPS: 500.0000 mg | ORAL_CAPSULE | Freq: Four times a day (QID) | ORAL | 0 refills | Status: DC

## 2023-03-17 MED ORDER — VANCOMYCIN HCL 2000 MG/400ML IV SOLN
2000.0000 mg | INTRAVENOUS | Status: DC
  Filled 2023-03-17: qty 400

## 2023-03-17 NOTE — ED Provider Notes (Signed)
[place] EMERGENCY DEPARTMENT AT [facility] Provider Note   CSN: [id number] Arrival date & time: 03/17/23  1752     History  Chief Complaint  Patient presents with   Groin Swelling    Thomas Bass is a 72 y.o. male.  HPI     72 year old male comes in with chief complaint of fever, groin swelling.  2 days ago patient had malaise and chills.  Yesterday he woke up, and started noticing swelling over his groin.  That swelling has worsened today.  Patient also started having fevers at home with Tmax of 102, prompting him to come to the emergency room.  He had gone to the urgent care earlier.  Patient has history of hypertension, hyperlipidemia.  He has no diabetes and no immunosuppression.  Patient denies any UTI-like symptoms.  No recent trauma in that area.  Patient denies any pain to his groin, unless he is walking.  Home Medications Prior to Admission medications   Medication Sig Start Date End Date Taking? Authorizing Provider  cephALEXin (KEFLEX) 500 MG capsule Take 1 capsule (500 mg total) by mouth 4 (four) times daily. 03/17/23  Yes Derwood Kaplan, MD  doxycycline (VIBRAMYCIN) 100 MG capsule Take 1 capsule (100 mg total) by mouth 2 (two) times daily. 03/17/23  Yes Derwood Kaplan, MD  aspirin EC 81 MG EC tablet Take 1 tablet (81 mg total) by mouth daily. 01/10/19   Black, Lesle Chris, NP  lisinopril (PRINIVIL,ZESTRIL) 10 MG tablet Take 10 mg by mouth daily.    [provider]  naproxen sodium (ALEVE) 220 MG tablet Take 220-440 mg by mouth daily as needed (pain).    [provider]  rosuvastatin (CRESTOR) 40 MG tablet Take 1 tablet (40 mg total) by mouth daily at 6 PM. 01/10/19   Black, Lesle Chris, NP  sildenafil (REVATIO) 20 MG tablet Take 20-100 mg by mouth as needed (ED).    [provider]  traZODone (DESYREL) 50 MG tablet Take 75 mg by mouth at bedtime.    [provider]      Allergies    Patient has no known allergies.     Review of Systems   Review of Systems  All other systems reviewed and are negative.   Physical Exam Updated Vital Signs BP (!) 142/86   Pulse 86   Temp 99.5 F (37.5 C) (Oral)   Resp (!) 25   Ht 5\' 11"  (1.803 m)   Wt 95.7 kg   SpO2 96%   BMI 29.43 kg/m  Physical Exam Vitals and nursing note reviewed.  Constitutional:      Appearance: He is well-developed.  HENT:     Head: Atraumatic.  Cardiovascular:     Rate and Rhythm: Normal rate.  Pulmonary:     Effort: Pulmonary effort is normal.  Genitourinary:    Comments: Patient has significant edema/lymphadenopathy over the right inguinal crease and right above the inguinal crease.  No fluctuant mass appreciated. Musculoskeletal:     Cervical back: Neck supple.  Skin:    General: Skin is warm.  Neurological:     Mental Status: He is alert and oriented to person, place, and time.     ED Results / Procedures / Treatments   Labs (all labs ordered are listed, but only abnormal results are displayed) Labs Reviewed  CBC WITH DIFFERENTIAL/PLATELET - Abnormal; Notable for the following components:      Result Value   WBC 13.1 (*)    Neutro Abs  10.8 (*)    Monocytes Absolute 1.5 (*)    All other components within normal limits  COMPREHENSIVE METABOLIC PANEL - Abnormal; Notable for the following components:   Sodium 134 (*)    Glucose, Bld 111 (*)    All other components within normal limits  PROTIME-INR - Abnormal; Notable for the following components:   Prothrombin Time 15.7 (*)    All other components within normal limits  URINALYSIS, W/ REFLEX TO CULTURE (INFECTION SUSPECTED) - Abnormal; Notable for the following components:   Hgb urine dipstick LARGE (*)    Protein, ur TRACE (*)    All other components within normal limits  RESP PANEL BY RT-PCR (RSV, FLU A&B, COVID)  RVPGX2  CULTURE, BLOOD (ROUTINE X 2)  CULTURE, BLOOD (ROUTINE X 2)  LACTIC ACID, PLASMA  LACTIC ACID, PLASMA  APTT    EKG EKG  Interpretation  Date/Time:  Saturday Mar 17 2023 19:14:03 EDT Ventricular Rate:  94 PR Interval:  179 QRS Duration: 87 QT Interval:  335 QTC Calculation: 419 R Axis:   56 Text Interpretation: Sinus rhythm No acute changes normal intervals No significant change since last tracing Confirmed by Derwood Kaplan ([id number]) on 03/17/2023 8:58:17 PM  Radiology CT PELVIS W CONTRAST  Result Date: 03/17/2023 CLINICAL DATA:  Lymphadenopathy of the groin. EXAM: CT PELVIS WITH CONTRAST TECHNIQUE: Multidetector CT imaging of the pelvis was performed using the standard protocol following the bolus administration of intravenous contrast. RADIATION DOSE REDUCTION: This exam was performed according to the departmental dose-optimization program which includes automated exposure control, adjustment of the mA and/or kV according to patient size and/or use of iterative reconstruction technique. CONTRAST:  OMNIPAQUE IOHEXOL 300 MG/ML  SOLN COMPARISON:  None Available. FINDINGS: Urinary Tract: No evidence of nephrolithiasis or hydronephrosis. Urinary bladder is distended, which may be secondary to urinary outlet obstruction in the settings of enlarged prostate. Bowel: Scattered colonic diverticulosis without evidence of acute diverticulitis. Normal appendix. Vascular/Lymphatic: Mild atherosclerotic calcification of abdominal aorta and branch vessels. Reproductive: Prostate is enlarged measuring up to 6.4 cm in transverse dimension. Other: There is right inguinal region fat stranding with multiple enlarged lymph nodes. There is no well-defined fluid collection or abscess. The fat stranding area measures a proximally 3.8 x 3.4 x 3.5 cm. No evidence of emphysema. Musculoskeletal: No acute osseous abnormality. 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 IMPRESSION: 1. Right inguinal region fat stranding with multiple enlarged lymph nodes, consistent with cellulitis. No well-defined fluid collection or  abscess, clinical correlation and follow-up examination is suggested. 2. Scattered colonic diverticulosis without evidence of acute diverticulitis. 3. Prostatomegaly. 4. Urinary bladder is distended, which may be secondary to urinary outlet obstruction in the settings of enlarged prostate. 5. 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 6. Aortic atherosclerosis. Electronically Signed   By: Larose Hires D.O.   On: 03/17/2023 20:48    Procedures Procedures    Medications Ordered in ED Medications  cefTRIAXone (ROCEPHIN) 2 g in sodium chloride 0.9 % 100 mL IVPB (0 g Intravenous Stopped 03/17/23 2106)  0.9 %  sodium chloride infusion (has no administration in time range)  vancomycin (VANCOCIN) IVPB 1000 mg/200 mL premix (has no administration in time range)    Followed by  vancomycin (VANCOCIN) IVPB 1000 mg/200 mL premix (has no administration in time range)  acetaminophen (TYLENOL) tablet 650 mg (650 mg Oral Given 03/17/23 1807)  sodium chloride 0.9 % bolus 1,000 mL (0 mLs Intravenous  Stopped 03/17/23 2056)  iohexol (OMNIPAQUE) 300 MG/ML solution 100 mL (100 mLs Intravenous Contrast Given 03/17/23 2029)    ED Course/ Medical Decision Making/ A&P                             Medical Decision Making Amount and/or Complexity of Data Reviewed Labs: ordered. Radiology: ordered. ECG/medicine tests: ordered.  Risk OTC drugs. Prescription drug management.   This patient presents to the ED with chief complaint(s) of right groin swelling, fevers with pertinent past medical history of hypertension, hyperlipidemia and no immunosuppression.The complaint involves an extensive differential diagnosis and also carries with it a high risk of complications and morbidity.    The differential diagnosis includes : Reactive lymphadenitis, lymphoma, cellulitis, necrotic lesion, abscess  The initial plan is to get basic labs, initiate sepsis workup as patient is febrile and  tachycardic.  There is a possibility he might need admission.   Additional history obtained: Additional history obtained from spouse Records reviewed previous admission documents and Primary Care Documents, also reviewed patient's medications.  Independent labs interpretation:  The following labs were independently interpreted: CBC reveals white count of 13.1.  Patient has normal creatinine.  No organ dysfunction seen.  Independent visualization and interpretation of imaging: - I independently visualized the following imaging with scope of interpretation limited to determining acute life threatening conditions related to emergency care: CT pelvis, which revealed no evidence of large fluid collection or free air  Treatment and Reassessment: Patient reassessed.  He has received ceftriaxone.  I discussed with him the CT findings.  I discussed with him the lab findings.  I asked patient if he is comfortable going home versus he wants to stay in the hospital for IV antibiotics to ensure he is improving before he goes home.  Patient clinically not septic.  He is comfortable going home.  We will give him IV vancomycin while he is here.  He will be discharged with both Doxy and Keflex.  The rash will be demarcated.  Return precautions have been discussed with the patient and his wife, they are comfortable with discharge after the antibiotics.   Consideration for admission or further workup: Admission considered, however patient does not have sepsis and is reliable, immunocompetent.  He is comfortable with discharge with return precautions.  Final Clinical Impression(s) / ED Diagnoses Final diagnoses:  Cellulitis of groin    Rx / DC Orders ED Discharge Orders          Ordered    cephALEXin (KEFLEX) 500 MG capsule  4 times daily        03/17/23 2134    doxycycline (VIBRAMYCIN) 100 MG capsule  2 times daily        03/17/23 2134              Derwood Kaplan, MD 03/17/23 2139

## 2023-03-17 NOTE — ED Notes (Signed)
Second gram of vancomycin infusing at present.

## 2023-03-17 NOTE — Sepsis Progress Note (Signed)
Elink following for sepsis protocol. 

## 2023-03-17 NOTE — Discharge Instructions (Addendum)
We saw in the emergency room for groin swelling and fevers.  Our workup here reveals that you have cellulitis.  Labs are otherwise reassuring, it does not reveal any evidence of organ dysfunction.  Since you are healthy, have a robust immune system and you are reliable -we feel comfortable with you getting oral antibiotics.  We would like you to return for wound recheck either at your PCP or ER in 3 days for wound recheck.  Return to the ER sooner if you start noticing that the redness is spreading, you have increased pain, fevers, chills, severe vomiting.

## 2023-03-17 NOTE — Progress Notes (Addendum)
Pharmacy Antibiotic Note  Thomas Bass is a 72 y.o. male for which pharmacy has been consulted for vancomycin dosing for cellulitis.  SCr 1.03 WBC 13.1; LA 0.7; T 99.5; HR 86; RR 25 COVID neg / flu neg  Plan: Ceftriaxone per MD Vancomycin 2000 mg q24hr (eAUC 464) unless change in renal function Trend WBC, Fever, Renal function F/u cultures, clinical course, WBC De-escalate when able Levels at steady state  Height: 5\' 11"  (180.3 cm) Weight: 95.7 kg (211 lb) IBW/kg (Calculated) : 75.3  Temp (24hrs), Avg:100.8 F (38.2 C), Min:99.5 F (37.5 C), Max:102.1 F (38.9 C)  Recent Labs  Lab 03/17/23 1813 03/17/23 1918  WBC 13.1*  --   CREATININE  --  1.03  LATICACIDVEN 1.4 0.7    Estimated Creatinine Clearance: 77.7 mL/min (by C-G formula based on SCr of 1.03 mg/dL).    No Known Allergies  Microbiology results: Pending  Thank you for allowing pharmacy to be a part of this patient's care.  Delmar Landau, PharmD, BCPS 03/17/2023 9:33 PM ED Clinical Pharmacist -  [phone]

## 2023-03-17 NOTE — ED Triage Notes (Signed)
Lump on R side of groin. Notice yesterday. Temp 102 at home  Seen at [facility] sent for futur treatment. Size of egg firm  Frequent small amounts of urine

## 2023-03-18 NOTE — ED Notes (Signed)
Reviewed AVS/discharge instruction with patient. Time allotted for and all questions answered. Patient is agreeable for d/c and escorted to ed exit by staff.  

## 2023-03-19 ENCOUNTER — Emergency Department (HOSPITAL_BASED_OUTPATIENT_CLINIC_OR_DEPARTMENT_OTHER)
Admission: EM | Admit: 2023-03-19 | Discharge: 2023-03-19 | Disposition: A | Discharge status: Home / Self Care | Payer: Medicare Other | Attending: Emergency Medicine | Admitting: Emergency Medicine

## 2023-03-19 ENCOUNTER — Encounter (HOSPITAL_BASED_OUTPATIENT_CLINIC_OR_DEPARTMENT_OTHER): Payer: Self-pay

## 2023-03-19 DIAGNOSIS — Z79899 Other long term (current) drug therapy: Secondary | ICD-10-CM | POA: Insufficient documentation

## 2023-03-19 DIAGNOSIS — L03818 Cellulitis of other sites: Secondary | ICD-10-CM

## 2023-03-19 DIAGNOSIS — Z7982 Long term (current) use of aspirin: Secondary | ICD-10-CM | POA: Diagnosis not present

## 2023-03-19 DIAGNOSIS — L02214 Cutaneous abscess of groin: Secondary | ICD-10-CM | POA: Diagnosis present

## 2023-03-19 LAB — CULTURE, BLOOD (ROUTINE X 2)

## 2023-03-19 NOTE — Discharge Instructions (Signed)
You have an area of infected skin called cellulitis.  you have started antibiotics.  These antibiotics do not work instantaneously.   - You should allow at least 48-72 hours after your first dose for the cellulitis to slow it's spreading.   - You should allow 72-96  hours after the first dose to see the cellulitis to start improving.   - These things may happen quicker than that but allow at least these limitations prior to returning to the emergency department or your primary doctor.   - The exceptions to this are if you become systemically ill such as having a fever, nausea, vomiting, malaise.  - The other exception is if you have a rapid spreading of the cellulitis or the redness and swelling and pain.  If you notice more than a couple inches of spreading over a couple hours you need to return to the emergency department immediately because this could be a life-threatening infection. If you notice red streaking from the site you also need to return for reevaluation. If you notice a foul odor, crunchy feeling skin or get a fever, malaise, chills then return to the ED for reevaluation. - If not, follow up with your primary doctor as instructed for reevaluation.

## 2023-03-19 NOTE — ED Triage Notes (Signed)
Lump on right groin, seen yesterday for same. Patient reports that size and pain have increased. Patient has started antibiotic.

## 2023-03-20 LAB — CULTURE, BLOOD (ROUTINE X 2): Culture: NO GROWTH

## 2023-03-20 NOTE — ED Provider Notes (Signed)
EMERGENCY DEPARTMENT AT [facility] Provider Note   CSN: [id number] Arrival date & time: 03/19/23  0547     History  Chief Complaint  Patient presents with   Abscess    Right Groin    Thomas Bass is a 72 y.o. male.  Seen here and diagnosed with cellulitis about 30 hours ago. Had ct scan done showing no necrotizing infection, abscess or deep infection. States that redness has spread some since then and thought he should come back for reevaluation. States he feels better than previously but might have more pain. Took antibiotics yesterday as directed. Had IV abx prior to discharge from here. No fevers. Tolerating PO.    Abscess      Home Medications Prior to Admission medications   Medication Sig Start Date End Date Taking? Authorizing Provider  aspirin EC 81 MG EC tablet Take 1 tablet (81 mg total) by mouth daily. 01/10/19   Black, Lesle Chris, NP  cephALEXin (KEFLEX) 500 MG capsule Take 1 capsule (500 mg total) by mouth 4 (four) times daily. 03/17/23   Derwood Kaplan, MD  doxycycline (VIBRAMYCIN) 100 MG capsule Take 1 capsule (100 mg total) by mouth 2 (two) times daily. 03/17/23   Derwood Kaplan, MD  lisinopril (PRINIVIL,ZESTRIL) 10 MG tablet Take 10 mg by mouth daily.    [provider]  naproxen sodium (ALEVE) 220 MG tablet Take 220-440 mg by mouth daily as needed (pain).    [provider]  rosuvastatin (CRESTOR) 40 MG tablet Take 1 tablet (40 mg total) by mouth daily at 6 PM. 01/10/19   Black, Lesle Chris, NP  sildenafil (REVATIO) 20 MG tablet Take 20-100 mg by mouth as needed (ED).    [provider]  traZODone (DESYREL) 50 MG tablet Take 75 mg by mouth at bedtime.    [provider]      Allergies    Patient has no known allergies.    Review of Systems   Review of Systems  Physical Exam Updated Vital Signs BP 121/69   Pulse 84   Temp 98.3 F (36.8 C)   Resp 20   Ht 5\' 11"  (1.803 m)   Wt 95.7 kg   SpO2 97%    BMI 29.43 kg/m  Physical Exam Vitals and nursing note reviewed.  Constitutional:      Appearance: He is well-developed.  HENT:     Head: Normocephalic and atraumatic.  Cardiovascular:     Rate and Rhythm: Normal rate.  Pulmonary:     Effort: Pulmonary effort is normal. No respiratory distress.  Abdominal:     General: There is no distension.  Genitourinary:    Comments: Erythema to right groin area. Some spots it appears that it has regressed some (superior and lateral) others it has advanced a couple centimeters past marking. No crepitus, abnormal coloring or foul smelling. Has an area of induration towards the middle of the cellulitic area.  Musculoskeletal:        General: Normal range of motion.     Cervical back: Normal range of motion.  Neurological:     Mental Status: He is alert.     ED Results / Procedures / Treatments   Labs (all labs ordered are listed, but only abnormal results are displayed) Labs Reviewed - No data to display  EKG None  Radiology No results found.  Procedures Ultrasound ED Soft Tissue  Date/Time: 03/20/2023 3:51 AM  Performed by: Marily Memos, MD Authorized by: Marily Memos,  MD   Procedure details:    Indications: localization of abscess and evaluate for cellulitis     Transverse view:  Visualized   Longitudinal view:  Visualized   Images: archived     Limitations:  Body habitus Location:    Location: pelvic wall     Side:  Right Findings:     no abscess present    cellulitis present    no foreign body present Comments:     No evidence of abscess or air in fascial plane.     Medications Ordered in ED Medications - No data to display  ED Course/ Medical Decision Making/ A&P                             Medical Decision Making  I discussed the patient that there didn't appear to be an abscess or other concerning features for necrotizing infection. His VS are better than they were previously and that I suspect that this  is the normal evolution of a skin infection as it often takes 2-3 days to start seeing vast improvement in his symptoms. I offered to recheck his labs for WBC however neither thought it was of any utility at this time. BUS done and doesn't show anyting concerning for nec fasc, fourniers, abscess or other complications. Since he felt better and was reassured, will d/c. Told him to return with any change in how he feels, high fevers, rapid spreading, maolodorous discharge, crunch feeling in the area and described what an abscess feels like. Also asked him to return if it didn't start improving by 72-96 hours.    Final Clinical Impression(s) / ED Diagnoses Final diagnoses:  Cellulitis of other specified site    Rx / DC Orders ED Discharge Orders     None         Makaiya Geerdes, Barbara Cower, MD 03/20/23 [phone]

## 2023-03-21 LAB — CULTURE, BLOOD (ROUTINE X 2): Special Requests: ADEQUATE

## 2023-03-22 LAB — CULTURE, BLOOD (ROUTINE X 2)

## 2023-03-27 ENCOUNTER — Other Ambulatory Visit: Payer: Self-pay

## 2023-03-27 ENCOUNTER — Emergency Department (HOSPITAL_BASED_OUTPATIENT_CLINIC_OR_DEPARTMENT_OTHER)
Admission: EM | Admit: 2023-03-27 | Discharge: 2023-03-27 | Disposition: A | Discharge status: Home / Self Care | Payer: Medicare Other | Attending: Emergency Medicine | Admitting: Emergency Medicine

## 2023-03-27 ENCOUNTER — Encounter (HOSPITAL_BASED_OUTPATIENT_CLINIC_OR_DEPARTMENT_OTHER): Payer: Self-pay | Admitting: Emergency Medicine

## 2023-03-27 DIAGNOSIS — L03115 Cellulitis of right lower limb: Secondary | ICD-10-CM

## 2023-03-27 DIAGNOSIS — Z79899 Other long term (current) drug therapy: Secondary | ICD-10-CM | POA: Insufficient documentation

## 2023-03-27 DIAGNOSIS — I10 Essential (primary) hypertension: Secondary | ICD-10-CM | POA: Diagnosis not present

## 2023-03-27 DIAGNOSIS — L02214 Cutaneous abscess of groin: Secondary | ICD-10-CM

## 2023-03-27 DIAGNOSIS — R1909 Other intra-abdominal and pelvic swelling, mass and lump: Secondary | ICD-10-CM | POA: Diagnosis present

## 2023-03-27 DIAGNOSIS — Z7982 Long term (current) use of aspirin: Secondary | ICD-10-CM | POA: Diagnosis not present

## 2023-03-27 MED ORDER — CEPHALEXIN 250 MG PO CAPS
500.0000 mg | ORAL_CAPSULE | Freq: Once | ORAL | Status: AC
  Administered 2023-03-27: 500 mg via ORAL
  Filled 2023-03-27: qty 2

## 2023-03-27 MED ORDER — CEPHALEXIN 500 MG PO CAPS: 500.0000 mg | ORAL_CAPSULE | Freq: Four times a day (QID) | ORAL | 0 refills | Status: DC

## 2023-03-27 NOTE — ED Triage Notes (Signed)
Pt arrives pov, steady gait, requesting re-eval of cellulitis/abscess to groin. Pt endorses draining today. Completed abx

## 2023-03-27 NOTE — ED Notes (Signed)
ED Provider at bedside with US 

## 2023-03-27 NOTE — Discharge Instructions (Signed)
You were seen in the emergency department for your groin swelling and drainage.  You did have a small abscess on your ultrasound that was less than a centimeter in size.  Because it was draining on its own we did not need to open it up to drain it further and you should continue to do warm compresses for 15 to 20 minutes at a time several times a day to continue to encourage drainage.  You did have skin infection around the abscess as well and we have restarted you on antibiotics and you should complete these as prescribed.  You should follow-up with your primary doctor in the next few days to have your wound rechecked.  You should return to the emergency department if you are having increased swelling, spreading of the redness despite the antibiotics, fevers despite the antibiotics or any other new or concerning symptoms.

## 2023-03-27 NOTE — ED Provider Notes (Signed)
[place] EMERGENCY DEPARTMENT AT [facility] Provider Note   CSN: [id number] Arrival date & time: 03/27/23  0802     History  Chief Complaint  Patient presents with   Wound Check    Thomas Bass is a 72 y.o. male.  Patient is a 72 year old male with past medical history of hypertension and hyperlipidemia presenting to the emergency department with groin swelling.  He states that he had cellulitis in his groin about 1 to 2 weeks ago and was treated with antibiotics.  He states that the redness significantly improved however he continued to feel a small lump in his right groin that had a little bit of redness around it.  He states that he has been placing antibiotic ointment on it however this morning started to notice that it was draining a reddish-yellow fluid.  He denies any fevers or significant pain.  He states that he did complete his antibiotics a few days ago.  The history is provided by the patient and the spouse.  Wound Check       Home Medications Prior to Admission medications   Medication Sig Start Date End Date Taking? Authorizing Provider  cephALEXin (KEFLEX) 500 MG capsule Take 1 capsule (500 mg total) by mouth 4 (four) times daily. 03/27/23  Yes Elayne Snare K, DO  aspirin EC 81 MG EC tablet Take 1 tablet (81 mg total) by mouth daily. 01/10/19   Black, Lesle Chris, NP  doxycycline (VIBRAMYCIN) 100 MG capsule Take 1 capsule (100 mg total) by mouth 2 (two) times daily. 03/17/23   Derwood Kaplan, MD  lisinopril (PRINIVIL,ZESTRIL) 10 MG tablet Take 10 mg by mouth daily.    [provider]  naproxen sodium (ALEVE) 220 MG tablet Take 220-440 mg by mouth daily as needed (pain).    [provider]  rosuvastatin (CRESTOR) 40 MG tablet Take 1 tablet (40 mg total) by mouth daily at 6 PM. 01/10/19   Black, Lesle Chris, NP  sildenafil (REVATIO) 20 MG tablet Take 20-100 mg by mouth as needed (ED).    [provider]  traZODone (DESYREL) 50 MG  tablet Take 75 mg by mouth at bedtime.    [provider]      Allergies    Patient has no known allergies.    Review of Systems   Review of Systems  Physical Exam Updated Vital Signs BP (!) 142/85   Pulse 80   Temp 98.1 F (36.7 C)   Resp 16   Wt 92.1 kg   SpO2 96%   BMI 28.31 kg/m  Physical Exam Vitals and nursing note reviewed.  Constitutional:      General: He is not in acute distress.    Appearance: Normal appearance.  HENT:     Head: Normocephalic.     Nose: Nose normal.     Mouth/Throat:     Mouth: Mucous membranes are moist.  Eyes:     Extraocular Movements: Extraocular movements intact.  Cardiovascular:     Rate and Rhythm: Normal rate.  Pulmonary:     Effort: Pulmonary effort is normal.  Abdominal:     General: Abdomen is flat.  Musculoskeletal:        General: Normal range of motion.     Cervical back: Normal range of motion.  Skin:    General: Skin is warm and dry.     Comments: Approximately 1 cm in diameter nodular joints in the right groin with approximately 2 cm of surrounding erythema  and induration mild tenderness to palpation, draining serosanguineous fluid  Neurological:     General: No focal deficit present.     Mental Status: He is alert and oriented to person, place, and time.  Psychiatric:        Mood and Affect: Mood normal.        Behavior: Behavior normal.     ED Results / Procedures / Treatments   Labs (all labs ordered are listed, but only abnormal results are displayed) Labs Reviewed - No data to display  EKG None  Radiology No results found.  Procedures Ultrasound ED Soft Tissue  Date/Time: 03/27/2023 8:31 AM  Performed by: Rexford Maus, DO Authorized by: Rexford Maus, DO   Procedure details:    Indications: localization of abscess and evaluate for cellulitis     Transverse view:  Visualized   Longitudinal view:  Visualized   Images: archived   Location:    Location: groin     Side:   Right Findings:     abscess present (~0.5 x 0.8 cm)    cellulitis present     Medications Ordered in ED Medications  cephALEXin (KEFLEX) capsule 500 mg (has no administration in time range)    ED Course/ Medical Decision Making/ A&P                             Medical Decision Making This patient presents to the ED with chief complaint(s) of groin swelling with pertinent past medical history of HTN, HLD which further complicates the presenting complaint. The complaint involves an extensive differential diagnosis and also carries with it a high risk of complications and morbidity.    The differential diagnosis includes abscess, cellulitis, lymphadenopathy, subcutaneous cyst no signs of sepsis on exam  Additional history obtained: Additional history obtained from family Records reviewed prior ED records  ED Course and Reassessment: On patient's arrival he is hemodynamically stable in no acute distress.  He does have a small area of fluctuance with surrounding induration and erythema to his right groin that is draining a serosanguineous fluid.  Bedside ultrasound was performed that did show small abscess less than 1 cm in size.  It is actively draining on exam and did not require further I&D.  He does have surrounding cellulitis on ultrasound as well.  He will be restarted on antibiotics and was recommended continued warm compresses and close primary care follow-up and was given strict return precautions.  Independent labs interpretation:  N/A  Independent visualization of imaging: N/A  Consultation: - Consulted or discussed management/test interpretation w/ external professional: N/A  Consideration for admission or further workup: Patient has no emergent conditions requiring admission or further work-up at this time and is stable for discharge home with primary care follow-up  Social Determinants of health: N/A            Final Clinical Impression(s) / ED  Diagnoses Final diagnoses:  Abscess of groin, right  Cellulitis of right lower extremity    Rx / DC Orders ED Discharge Orders          Ordered    cephALEXin (KEFLEX) 500 MG capsule  4 times daily        03/27/23 0833              Rexford Maus, DO 03/27/23 [phone]

## 2023-03-27 NOTE — ED Notes (Signed)
Discharge instructions, wound care, follow up care, and prescription reviewed and explained, pt verbalized understanding. Pt caox4, ambulatory, NAD on d/c.

## 2023-12-06 ENCOUNTER — Telehealth: Payer: Self-pay | Admitting: Internal Medicine

## 2023-12-06 DIAGNOSIS — G4733 Obstructive sleep apnea (adult) (pediatric): Secondary | ICD-10-CM

## 2023-12-06 NOTE — Telephone Encounter (Signed)
Would like CPAP setting adjusted. He states it is blowing too hard. His # is [phone]

## 2023-12-06 NOTE — Telephone Encounter (Signed)
Dr Maple Hudson,  What are the suggested settings?

## 2023-12-07 NOTE — Telephone Encounter (Signed)
I have ordered [employer] to change pressure range to autopap 4-15. Patient is overdue for annual follow-up. Please arrange routine f/u visit.

## 2023-12-11 NOTE — Telephone Encounter (Signed)
 Routing to front office to call and schedule pt with CY

## 2023-12-11 NOTE — Telephone Encounter (Signed)
 Pt is overdue for a f/u. Dr Maple Hudson would like a scheduled appointment not just based on cpap. He has not been seen in over a year

## 2023-12-12 NOTE — Telephone Encounter (Signed)
 Patient returned voicemail. He insisted that he did not need a follow up and will figure it out on his own.

## 2023-12-13 NOTE — Telephone Encounter (Signed)
 Spoke to patient and explained need for yearly visit. He stated that his cpap is paid for and he purchases his supplies out of pocket. He does not feel that he needs an appointment at this time.   Routing to [employer] [facility] as an [place]

## 2023-12-14 ENCOUNTER — Telehealth: Payer: Self-pay | Admitting: Internal Medicine

## 2023-12-14 NOTE — Telephone Encounter (Signed)
 Burna Forts; Nils Flack; Kathe Becton; Randie Heinz, [facility],  Order received. I am only able to send this is for the settings change. We will need OV face 2 face notes showing usage and benefit for the supply request. Looks like Patient has not been seen in over a year.  Thank you,  Luellen Pucker

## 2023-12-14 NOTE — Telephone Encounter (Signed)
 I called and spoke with the pt  He refused appt  He understands that this means his CPAP supplies will not be given by DME  He states he has already explained this to [place] multiple times  I am closing encounter

## 2024-01-16 NOTE — Telephone Encounter (Signed)
 Closing based on PT's comment below that he will figure it out on his own.

## 2024-01-29 ENCOUNTER — Ambulatory Visit: Payer: Medicare Other | Attending: Cardiology | Admitting: Cardiology

## 2024-01-29 ENCOUNTER — Encounter: Payer: Self-pay | Admitting: Cardiology

## 2024-01-29 VITALS — BP 124/86 | HR 70 | Ht 70.0 in | Wt 210.8 lb

## 2024-01-29 DIAGNOSIS — G4733 Obstructive sleep apnea (adult) (pediatric): Secondary | ICD-10-CM

## 2024-01-29 DIAGNOSIS — I10 Essential (primary) hypertension: Secondary | ICD-10-CM | POA: Diagnosis not present

## 2024-01-29 DIAGNOSIS — E785 Hyperlipidemia, unspecified: Secondary | ICD-10-CM | POA: Diagnosis not present

## 2024-01-29 DIAGNOSIS — R6889 Other general symptoms and signs: Secondary | ICD-10-CM | POA: Diagnosis not present

## 2024-01-29 DIAGNOSIS — R931 Abnormal findings on diagnostic imaging of heart and coronary circulation: Secondary | ICD-10-CM

## 2024-01-29 DIAGNOSIS — R072 Precordial pain: Secondary | ICD-10-CM

## 2024-01-29 DIAGNOSIS — R7303 Prediabetes: Secondary | ICD-10-CM

## 2024-01-29 MED ORDER — METOPROLOL TARTRATE 100 MG PO TABS: 100.0000 mg | ORAL_TABLET | Freq: Once | ORAL | 0 refills | Status: DC

## 2024-01-29 NOTE — Progress Notes (Unsigned)
 Cardiology Office Note:  .   Date:  01/30/2024  ID:  Thomas Bass, DOB 1950/11/21, MRN [id number] PCP: Thomas Brunette, MD  [facility] Providers Cardiologist:  Bryan Lemma, MD     Chief Complaint  Patient presents with   Follow-up    Doing well.  No major issues; just notes subjective fatigue; exercise intolerance   Coronary Artery Disease    No angina; chest notes exercise intolerance    Patient Profile: .     Thomas Bass is a  73 y.o. male with a PMH notable for HTN, HLD, pre-DM, OSA on CPAP with significant Coronary Calcium Score (1339) who presents here for 2-month follow-up at the request of Thomas Brunette, MD.    Medhansh Brinkmeier Mastandrea was last seen on 06/09/2023 via telehealth: No active cardiac symptoms.  Had started walking on days he was not playing golf and was tried to do more walking with golf and riding.  Blood pressure is for the most part controlled but could get into the 140s.  With him being completely asymptomatic we chose to forego further testing but continued to treat with high-dose statin and aspirin.  BP well-controlled therefore not on beta-blocker, was on ACE inhibitor.  Subjective  Discussed the use of AI scribe software for clinical note transcription with the patient, who gave verbal consent to proceed. History of Present Illness History of Present Illness Thomas Bass "Thomas Bass" is a 73 year old male with a high coronary calcium score who presents with fatigue.  He has a high coronary calcium score of 1339, identified during a previous evaluation. Despite this, he is asymptomatic with no chest pain, pressure, or shortness of breath during daily activities or exertion. An echocardiogram in 2020 was normal, and an EKG performed today was also normal.  He experiences fatigue, particularly in the afternoons, describing it as a slowing down after lunch. No chest pain, pressure, or tightness, and no shortness of breath during daily activities or while lying  flat. He mentions a lack of motivation, which he has discussed with his regular doctor.  He remains active, playing golf regularly, and sometimes playing an extra nine holes after eighteen. However, he notes that upon returning home, he feels more inclined to rest on the couch, which is a change from his previous activity level. He rides a cart while playing golf due to past knee issues, which have since resolved.  His current medications include aspirin, lisinopril 10 mg, and Crestor 40 mg. His last cholesterol check in June 2024 showed a total cholesterol of 141, triglycerides of 104, HDL of 68, and LDL of 54. His A1c was 5.9, and his kidney function and potassium levels were normal.  Cardiovascular ROS: no chest pain or dyspnea on exertion positive for - maybe mild swelling but just basically some mild exertional fatigue negative for - irregular heartbeat, orthopnea, palpitations, paroxysmal nocturnal dyspnea, rapid heart rate, shortness of breath, or lightheadedness, dizziness or wooziness, syncope/near syncope or TIA/amaurosis fugax, claudication     Objective    Studies Reviewed: Marland Kitchen   EKG Interpretation Date/Time:  Tuesday January 29 2024 16:07:48 EDT Ventricular Rate:  70 PR Interval:  172 QRS Duration:  88 QT Interval:  396 QTC Calculation: 427 R Axis:   40  Text Interpretation: Normal sinus rhythm Normal ECG When compared with ECG of 17-Mar-2023 19:14, PREVIOUS ECG IS PRESENT Confirmed by Bryan Lemma ([id number]) on 01/29/2024 4:53:03 PM    Results LABS Total cholesterol: 141 (03/2023) Triglycerides:  104 (03/2023) HDL: 68 (03/2023) LDL: 54 (03/2023) HbA1c: 5.9 (03/2023) Creatinine: 0.8 (03/2023) Potassium: 4.3 (03/2023)  DIAGNOSTIC EKG: Normal (01/29/2024) Echocardiogram: Normal (2020)  Cor Ca Score 05/26/2022: CAC 1339; LAD 779, LCx 229, RCA 53, other 28   Risk Assessment/Calculations:            Physical Exam:   VS:  BP 124/86 (BP Location: Left Arm, Patient Position:  Sitting, Cuff Size: Normal)   Pulse 70   Ht 5\' 10"  (1.778 m)   Wt 210 lb 12.8 oz (95.6 kg)   SpO2 96%   BMI 30.25 kg/m    Wt Readings from Last 3 Encounters:  01/29/24 210 lb 12.8 oz (95.6 kg)  03/27/23 203 lb (92.1 kg)  03/19/23 211 lb (95.7 kg)    GEN: Well nourished, well groomed in no acute distress; mildly obese but otherwise healthy-appearing NECK: No JVD; No carotid bruits CARDIAC: Normal S1, S2; RRR, no murmurs, rubs, gallops RESPIRATORY:  Clear to auscultation without rales, wheezing or rhonchi ; nonlabored, good air movement. ABDOMEN: Soft, non-tender, non-distended; mild truncal obesity EXTREMITIES:  No edema; No deformity     ASSESSMENT AND PLAN: .    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  Asymptomatic but discussed possible underlying coronary plaque contributing to fatigue.  Explained evolving cardiology paradigm to identify dangerous plaques in asymptomatic patients to prevent cardiac events. Discussed CT scan benefits for identifying high-risk plaque features, with minimal risks from contrast dye, requiring renal function assessment. Anticipated 60% chance of normal findings. - Order CT scan of coronary arteries with IV contrast to assess for high-risk plaque features. - Check renal function with a chemistry panel prior to CT scan. - Schedule CT scan before June trip. - Follow up with CT scan results to determine need for further intervention.      Relevant Medications   sildenafil (VIAGRA) 50 MG tablet   HTN (hypertension) (Chronic)   Blood pressure well-controlled on lisinopril 10 mg. - Continue lisinopril 10 mg daily.      Relevant Medications   sildenafil (VIAGRA) 50 MG tablet   Other Relevant Orders   EKG 12-Lead (Completed)   Basic metabolic panel with GFR    Hyperlipidemia with target low density lipoprotein (LDL) cholesterol less than 70 mg/dL (Chronic)   Lipid profile well-controlled as of June 2024 on current dose of rosuvastatin/Crestor. - Continue Crestor 40 mg daily. - Due for lipid panel follow-up with PCP      Relevant Medications   sildenafil (VIAGRA) 50 MG tablet   Other Relevant Orders   Basic metabolic panel with GFR     Other   Exercise intolerance   Fatigue in afternoons could be related to undiagnosed coronary artery disease or non-cardiac issues. Decision for further cardiac evaluation influenced by high coronary calcium score. - Maintain current medication regimen, including aspirin, lisinopril, and Crestor. - Will check coronary CTA just to ensure no occlusive disease.      Relevant Orders   CT CORONARY MORPH W/CTA COR W/SCORE W/CA W/CM &/OR WO/CM   OSA (obstructive sleep apnea)   Continue to wear      Precordial pain   Random episodes of chest discomfort and exertional fatigue.  Based on significantly elevated cardiac score, we will plan for coronary CTA.  Relevant Orders   Basic metabolic panel with GFR   CT CORONARY MORPH W/CTA COR W/SCORE W/CA W/CM &/OR WO/CM   Prediabetes (Chronic)   A1c at 5.9, borderline for diabetes, no symptoms or complications. - Monitor A1c and blood glucose levels regularly.             Follow-Up: Return in about 2 months (around 03/30/2024) for Routine Follow-up after testing ~ 1-2 months, [employer].     Signed, Marykay Lex, MD, MS Bryan Lemma, M.D., M.S. Interventional Cardiologist  [facility]  Pager # [phone] Phone # [phone] [address] [place], [place] [address]

## 2024-01-29 NOTE — Patient Instructions (Addendum)
 Medication Instructions:  Se instruction below for one time dose of Metoprolol  *If you need a refill on your cardiac medications before your next appointment, please call your pharmacy*   Lab Work: BMP   If you have labs (blood work) drawn today and your tests are completely normal, you will receive your results only by: MyChart Message (if you have MyChart) OR A paper copy in the mail If you have any lab test that is abnormal or we need to change your treatment, we will call you to review the results.   Testing/Procedures: Your physician has requested that you have coronary  CTA. Coronary computed tomography (CT)angiogram  is a special type of CT scan that uses a computer to produce multi-dimensional views of major blood vessels throughout the heart.  CT angiography, a contrast material is injected through an IV to help visualize the blood vessels  a painless test that uses an x-ray machine to take clear, detailed pictures of your heart arteries .  Please follow instruction sheet as given.    Follow-Up: At [facility], you and your health needs are our priority.  As part of our continuing mission to provide you with exceptional heart care, we have created designated Provider Care Teams.  These Care Teams include your primary Cardiologist (physician) and Advanced Practice Providers (APPs -  Physician Assistants and Nurse Practitioners) who all work together to provide you with the care you need, when you need it.     Your next appointment:   2 month(s)  The format for your next appointment:   In Person  Provider:   Bryan Lemma, MD    Other Instructions    Your cardiac CT will be scheduled at one of the below locations:   [facility] [address] [place], [place] [address] [phone]    OR   [facility]. [facility] [address]  [place], [place] [address] Opening February 18, 2024    If scheduled at [facility],  please arrive at the [facility] and Children's Entrance (Entrance C2) of [facility] 30 minutes prior to test start time. You can use the FREE valet parking offered at entrance C (encouraged to control the heart rate for the test)  Proceed to the [facility] Radiology Department (first floor) to check-in and test prep.  All radiology patients and guests should use entrance C2 at [facility], accessed from [facility], even though the hospital's physical address listed is [address].      Please follow these instructions carefully (unless otherwise directed):  An IV will be required for this test and Nitroglycerin will be given.   Hold all erectile dysfunction medications at least 3 days (72 hrs) prior to test. (Ie viagra, cialis, sildenafil, tadalafil, etc)   On the Night Before the Test: Be sure to Drink plenty of water. Do not consume any caffeinated/decaffeinated beverages or chocolate 12 hours prior to your test. Do not take any antihistamines 12 hours prior to your test.  On the Day of the Test: Drink plenty of water until 1 hour prior to the test. Do not eat any food 1 hour prior to test. You may take your regular medications prior to the test.  Take metoprolol (Lopressor) 100 mg  two hours prior to test.          After the Test: Drink plenty of water. After receiving IV contrast, you may experience a mild flushed  feeling. This is normal. On occasion, you may experience a mild rash up to 24 hours after the test. This is not dangerous. If this occurs, you can take Benadryl 25 mg, Zyrtec, Claritin, or Allegra and increase your fluid intake. (Patients taking Tikosyn should avoid Benadryl, and may take Zyrtec, Claritin, or Allegra) If you experience trouble breathing, this can be serious. If it is severe call 911 IMMEDIATELY. If it is mild, please call our office.  We will call to schedule your test 2-4 weeks out understanding that some  insurance companies will need an authorization prior to the service being performed.   For more information and frequently asked questions, please visit our website : [email or web address]  For non-scheduling related questions, please contact the cardiac imaging nurse navigator should you have any questions/concerns: Cardiac Imaging Nurse Navigators Direct Office Dial: [phone]   For scheduling needs, including cancellations and rescheduling, please call [place], [phone].

## 2024-01-30 ENCOUNTER — Encounter: Payer: Self-pay | Admitting: Cardiology

## 2024-01-30 DIAGNOSIS — R072 Precordial pain: Secondary | ICD-10-CM | POA: Insufficient documentation

## 2024-01-30 DIAGNOSIS — R6889 Other general symptoms and signs: Secondary | ICD-10-CM | POA: Insufficient documentation

## 2024-01-30 DIAGNOSIS — R7303 Prediabetes: Secondary | ICD-10-CM | POA: Insufficient documentation

## 2024-01-30 NOTE — Assessment & Plan Note (Signed)
 A1c at 5.9, borderline for diabetes, no symptoms or complications. - Monitor A1c and blood glucose levels regularly.

## 2024-01-30 NOTE — Assessment & Plan Note (Signed)
 Lipid profile well-controlled as of June 2024 on current dose of rosuvastatin/Crestor. - Continue Crestor 40 mg daily. - Due for lipid panel follow-up with PCP

## 2024-01-30 NOTE — Assessment & Plan Note (Signed)
 Fatigue in afternoons could be related to undiagnosed coronary artery disease or non-cardiac issues. Decision for further cardiac evaluation influenced by high coronary calcium score. - Maintain current medication regimen, including aspirin, lisinopril, and Crestor. - Will check coronary CTA just to ensure no occlusive disease.

## 2024-01-30 NOTE — Assessment & Plan Note (Signed)
Continue to wear

## 2024-01-30 NOTE — Assessment & Plan Note (Signed)
 High coronary calcium score of 1339 suggests potential coronary artery disease.  Asymptomatic but discussed possible underlying coronary plaque contributing to fatigue.  Explained evolving cardiology paradigm to identify dangerous plaques in asymptomatic patients to prevent cardiac events. Discussed CT scan benefits for identifying high-risk plaque features, with minimal risks from contrast dye, requiring renal function assessment. Anticipated 60% chance of normal findings. - Order CT scan of coronary arteries with IV contrast to assess for high-risk plaque features. - Check renal function with a chemistry panel prior to CT scan. - Schedule CT scan before June trip. - Follow up with CT scan results to determine need for further intervention.

## 2024-01-30 NOTE — Assessment & Plan Note (Signed)
 Blood pressure well-controlled on lisinopril 10 mg. - Continue lisinopril 10 mg daily.

## 2024-01-30 NOTE — Assessment & Plan Note (Signed)
 Random episodes of chest discomfort and exertional fatigue.  Based on significantly elevated cardiac score, we will plan for coronary CTA.

## 2024-02-14 ENCOUNTER — Encounter (HOSPITAL_COMMUNITY): Payer: Self-pay

## 2024-02-18 ENCOUNTER — Ambulatory Visit (HOSPITAL_COMMUNITY)
Admission: RE | Admit: 2024-02-18 | Discharge: 2024-02-18 | Disposition: A | Discharge status: Home / Self Care | Source: Ambulatory Visit | Attending: Cardiovascular Disease | Admitting: Cardiovascular Disease

## 2024-02-18 DIAGNOSIS — R072 Precordial pain: Secondary | ICD-10-CM | POA: Diagnosis present

## 2024-02-18 DIAGNOSIS — R6889 Other general symptoms and signs: Secondary | ICD-10-CM | POA: Insufficient documentation

## 2024-02-18 DIAGNOSIS — I2510 Atherosclerotic heart disease of native coronary artery without angina pectoris: Secondary | ICD-10-CM | POA: Insufficient documentation

## 2024-02-18 MED ORDER — NITROGLYCERIN 0.4 MG SL SUBL
0.8000 mg | SUBLINGUAL_TABLET | Freq: Once | SUBLINGUAL | Status: AC
  Administered 2024-02-18: 0.8 mg via SUBLINGUAL

## 2024-02-18 MED ORDER — IOHEXOL 350 MG/ML SOLN
95.0000 mL | Freq: Once | INTRAVENOUS | Status: AC | PRN
  Administered 2024-02-18: 95 mL via INTRAVENOUS

## 2024-02-18 MED ORDER — NITROGLYCERIN 0.4 MG SL SUBL
SUBLINGUAL_TABLET | SUBLINGUAL | Status: AC
  Filled 2024-02-18: qty 2

## 2024-02-20 ENCOUNTER — Encounter: Payer: Self-pay | Admitting: Cardiology

## 2024-03-06 ENCOUNTER — Ambulatory Visit: Payer: Self-pay | Admitting: *Deleted

## 2024-04-04 ENCOUNTER — Encounter: Payer: Self-pay | Admitting: Cardiology

## 2024-04-04 ENCOUNTER — Ambulatory Visit: Attending: Cardiology | Admitting: Cardiology

## 2024-04-04 VITALS — BP 138/84 | HR 63 | Ht 70.0 in | Wt 209.0 lb

## 2024-04-04 DIAGNOSIS — I10 Essential (primary) hypertension: Secondary | ICD-10-CM | POA: Diagnosis not present

## 2024-04-04 DIAGNOSIS — E785 Hyperlipidemia, unspecified: Secondary | ICD-10-CM | POA: Diagnosis not present

## 2024-04-04 DIAGNOSIS — I2510 Atherosclerotic heart disease of native coronary artery without angina pectoris: Secondary | ICD-10-CM

## 2024-04-04 DIAGNOSIS — R6889 Other general symptoms and signs: Secondary | ICD-10-CM

## 2024-04-04 NOTE — Assessment & Plan Note (Signed)
 Blood pressure unusually high during visit, typically around 120/117 mmHg.  He is on lisinopril 10 mg daily Emphasized importance of maintaining blood pressure control to reduce cardiovascular risk. - Monitor blood pressure at home. - Take extra lisinopril 10 mg if blood pressure remains high. - Report any trends of high blood pressure.

## 2024-04-04 NOTE — Patient Instructions (Signed)

## 2024-04-04 NOTE — Assessment & Plan Note (Signed)
 Reassuring results on coronary CT angiogram.  I suspect that his exercise intolerance is more related to deconditioning.  He seems to be doing better now that he is exercising more.

## 2024-04-04 NOTE — Assessment & Plan Note (Signed)
 Based on his Coronary Calcium  Score target LDL of less than 55 would be prudent, but at least less than 70 is reasonable.  His most recent labs did show LDL was 54 in 2024 and slightly higher with recent labs where it was 62.  Still pretty was well within goal.  - Continue rosuvastatin  4 mg daily.  Labs followed by PCP.

## 2024-04-04 NOTE — Assessment & Plan Note (Signed)
 High coronary calcium  score of 1584 indicates significant plaque burden. Coronary CT angiogram shows minimal plaque in RCA and left main, mild plaque in circumflex and LAD. No significant blockages, positive remodeling. Low likelihood of angina or myocardial infarction in next five years. Emphasized benefits of rosuvastatin  and aspirin  in reducing myocardial infarction risk. - Continue rosuvastatin  40 mg daily for cholesterol management, and lisinopril 10 mg daily for BP management - Continue baby aspirin  (81 mg) for antiplatelet therapy. - Chew four baby aspirin  if chest pain occurs during exertion. - Maintain active lifestyle and healthy diet. - Monitor blood pressure regularly, take extra lisinopril if high. - Avoid smoking, manage cholesterol and blood pressure aggressively.

## 2024-04-04 NOTE — Progress Notes (Signed)
 Cardiology Office Note:  .   Date:  04/04/2024  ID:  Thomas Bass, DOB Apr 19, 1951, MRN [id number] PCP: Imelda Man, MD  [facility] Providers Cardiologist:  Randene Bustard, MD     Chief Complaint  Patient presents with   Follow-up    Discussed test results   Coronary Artery Disease    Coronary CTA results.  No symptoms.    Patient Profile: .     Thomas Bass is a Borderline use 73 y.o. male  with a PMH notable for  HTN, HLD, pre-DM, OSA on CPAP with significant Coronary Calcium  Score (1339) who presents here for 2 month f/u to discuss Coronary CT Angiogram results  Thomas Bass is here at the request of Imelda Man, MD.     Thomas Bass was last seen on January 29, 2024 to discuss his elevated Coronary Calcium  Score.  Maybe limited fatigue and some subjective exercise intolerance but he thought this was related to deconditioning.  BP well-controlled on ACE inhibitor and lipids well-controlled on statin.  Based on significant elevated coronary calcium  score over thousand, we felt it was prudent to proceed with more definitive evaluation with Coronary CTA.  He now presents to discuss results.  Subjective  Discussed the use of AI scribe software for clinical note transcription with the patient, who gave verbal consent to proceed.  History of Present Illness History of Present Illness Thomas Bass is a 73 year old male with coronary artery disease who presents for follow-up after coronary CT angiogram reviewed below that revealed coronary calcium  score of 1584 but with mild to moderate nonobstructive CAD.  He is accompanied by his wife here today to discuss results.  Not because of symptoms, but because of her concern.  He remains relatively active walking and exercising and notes that he is asymptomatic with no chest pain, pressure, or shortness of breath at rest or with exertion.  No PND orthopnea or edema.  No rapid irregular beats palpitations.  No  syncope or near syncope.  No TIA or emesis for gas.  No claudication.  His current medications include rosuvastatin  and baby aspirin .  His blood pressure was unusually high today, although it is typically around 120/80 mmHg. He is on lisinopril for blood pressure management.  He has a history of high cholesterol and is on rosuvastatin . Recent lab results show a total cholesterol of 152 mg/dL, triglycerides of 59 mg/dL, HDL of 78 mg/dL, and LDL of 62 mg/dL.  Recent lab work includes a hemoglobin A1c of 5.6%, indicating good glucose control. He mentions an incident of consuming six small danishes over the weekend prior to the lab work, which is not typical for him as he usually avoids sweets.  He is planning a trip to [place]  and is concerned about his health during the trip. He carries a baggie of regular strength aspirin  in his golf bag as a precautionary measure.  Cardiovascular ROS: no chest pain or dyspnea on exertion negative for - edema, irregular heartbeat, orthopnea, palpitations, paroxysmal nocturnal dyspnea, rapid heart rate, shortness of breath, or syncope or near syncope TIA or emesis fugax.  Positive for increased exercise and hope for weight loss.     Objective   Current Meds  Medication Sig   Acetaminophen  (TYLENOL ) 325 MG CAPS Take by mouth as needed.   aspirin  EC 81 MG EC tablet Take 1 tablet (81 mg total) by mouth daily.   finasteride (PROSCAR) 5 MG tablet Take 5 mg  by mouth daily.   lisinopril (PRINIVIL,ZESTRIL) 10 MG tablet Take 10 mg by mouth daily.   naproxen sodium (ALEVE) 220 MG tablet Take 220-440 mg by mouth daily as needed (pain).   rosuvastatin  (CRESTOR ) 40 MG tablet Take 1 tablet (40 mg total) by mouth daily at 6 PM.   sildenafil (VIAGRA) 50 MG tablet Take 50 mg by mouth as needed.   TRAZODONE HCL PO Take 75 mg by mouth daily.   Studies Reviewed: Thomas Bass         LABS (03/31/2024) HbA1c: 5.6%; ; Hb: 15.1 g/dL; Hct: 47.2%; PLT: 192 x10^3/uL; Cr: 1.01 mg/dL; PSA:  4 ng/mL  Total Cholesterol: 152 mg/dL; Triglycerides: 59 mg/dL; HDL: 78 mg/dL; LDL: 62 mg/dL   RADIOLOGY  Coronary CTA 02/29/2024 1. Mild CAD in proximal LAD, proximal first diagonal artery, and distal LCx, 25-49% stenosis, CADRADS 2.   2. Total plaque volume 1512 mm3 which is 88th percentile for age-and sex-matched controls (calcified plaque 279 mm3; non-calcified plaque 1233 mm3). TPV is extensive.   3. Coronary calcium  score is 1584, which places the patient in the 89th percentile for age and sex matched control.   4. Normal coronary origins with right dominance.  Risk Assessment/Calculations:            Physical Exam:   VS:  BP 138/84   Pulse 63   Ht 5' 10 (1.778 m)   Wt 209 lb (94.8 kg)   SpO2 96%   BMI 29.99 kg/m    Wt Readings from Last 3 Encounters:  04/04/24 209 lb (94.8 kg)  01/29/24 210 lb 12.8 oz (95.6 kg)  03/27/23 203 lb (92.1 kg)    GEN: Healthy-appearing.Well nourished, well groomed in no acute distress; normal mood and affect. NECK: No JVD; No carotid bruits CARDIAC: Normal S1, S2; RRR, no murmurs, rubs, gallops RESPIRATORY:  Clear to auscultation without rales, wheezing or rhonchi ; nonlabored, good air movement. ABDOMEN: Soft, non-tender, non-distended EXTREMITIES:  No edema; No deformity     ASSESSMENT AND PLAN: .    Problem List Items Addressed This Visit       Cardiology Problems   Coronary artery disease, non-occlusive - Primary (Chronic)   High coronary calcium  score of 1584 indicates significant plaque burden. Coronary CT angiogram shows minimal plaque in RCA and left main, mild plaque in circumflex and LAD. No significant blockages, positive remodeling. Low likelihood of angina or myocardial infarction in next five years. Emphasized benefits of rosuvastatin  and aspirin  in reducing myocardial infarction risk. - Continue rosuvastatin  40 mg daily for cholesterol management, and lisinopril 10 mg daily for BP management - Continue baby aspirin  (81  mg) for antiplatelet therapy. - Chew four baby aspirin  if chest pain occurs during exertion. - Maintain active lifestyle and healthy diet. - Monitor blood pressure regularly, take extra lisinopril if high. - Avoid smoking, manage cholesterol and blood pressure aggressively.      Hyperlipidemia with target low density lipoprotein (LDL) cholesterol less than 70 mg/dL (Chronic)   Based on his Coronary Calcium  Score target LDL of less than 55 would be prudent, but at least less than 70 is reasonable.  His most recent labs did show LDL was 54 in 2024 and slightly higher with recent labs where it was 62.  Still pretty was well within goal.  - Continue rosuvastatin  4 mg daily.  Labs followed by PCP.      Primary hypertension (Chronic)   Blood pressure unusually high during visit, typically around 120/117 mmHg.  He  is on lisinopril 10 mg daily Emphasized importance of maintaining blood pressure control to reduce cardiovascular risk. - Monitor blood pressure at home. - Take extra lisinopril 10 mg if blood pressure remains high. - Report any trends of high blood pressure.        Other   Exercise intolerance   Reassuring results on coronary CT angiogram.  I suspect that his exercise intolerance is more related to deconditioning.  He seems to be doing better now that he is exercising more.           Follow-Up: Return in about 1 year (around 04/04/2025) for [employer].  Total time spent: 30 min spent with patient + 12 min spent charting = 42 min I spent 42 minutes in the care of Thomas Bass today including reviewing outside labs from scanned labs from PCP (2 minutes), reviewing studies (personally reviewed the Coronary CTA images/diagrams and report-4 minutes), face to face time discussing treatment options (30), 6, and documenting in the encounter.    Signed, Arleen Lacer, MD, MS Randene Bustard, M.D., M.S. Interventional Chartered certified accountant   Pager # [phone]

## 2024-08-07 ENCOUNTER — Telehealth: Payer: Self-pay

## 2024-08-07 NOTE — Telephone Encounter (Signed)
 Copied from CRM [id number]. Topic: Clinical - Order For Equipment >> Aug 04, 2024  1:02 PM Celestine FALCON wrote: Reason for CRM: Ron is calling to obtain a cpap machine prescription for a new cpap sent to him via email at [email or web address]. He wants to order the new cpap machine through [email or web address] as his preference. Pt stated in order to complete his order for a new CPAP, he needs this prescription emailed to him.  Pt's phone number is [phone] ok to leave a vm or text.  We have not seen this pt in 2 years. Not appropriate to write a rx. I called and spoke to pt. Pt states thi was fine, he was not aware it has been 2 years but his pcp wrote his a rx anyway. Pt states he will schedule an appt with our office in the future. NFN
